# Patient Record
Sex: Female | Born: 1995
Health system: Southern US, Community
[De-identification: ages and names within clinical notes are randomized; demographics above are authoritative.]

## PROBLEM LIST (undated history)

## (undated) DIAGNOSIS — A6009 Herpesviral infection of other urogenital tract: Secondary | ICD-10-CM

## (undated) DIAGNOSIS — G43709 Chronic migraine without aura, not intractable, without status migrainosus: Secondary | ICD-10-CM

## (undated) DIAGNOSIS — R519 Headache, unspecified: Secondary | ICD-10-CM

## (undated) DIAGNOSIS — IMO0002 Reserved for concepts with insufficient information to code with codable children: Secondary | ICD-10-CM

## (undated) DIAGNOSIS — K219 Gastro-esophageal reflux disease without esophagitis: Secondary | ICD-10-CM

## (undated) DIAGNOSIS — F329 Major depressive disorder, single episode, unspecified: Secondary | ICD-10-CM

## (undated) DIAGNOSIS — F4310 Post-traumatic stress disorder, unspecified: Secondary | ICD-10-CM

## (undated) DIAGNOSIS — J189 Pneumonia, unspecified organism: Secondary | ICD-10-CM

## (undated) DIAGNOSIS — T7840XA Allergy, unspecified, initial encounter: Secondary | ICD-10-CM

## (undated) DIAGNOSIS — F419 Anxiety disorder, unspecified: Secondary | ICD-10-CM

## (undated) DIAGNOSIS — F32A Depression, unspecified: Secondary | ICD-10-CM

## (undated) DIAGNOSIS — J45909 Unspecified asthma, uncomplicated: Secondary | ICD-10-CM

## (undated) HISTORY — DX: Herpesviral infection of other urogenital tract: A60.09

## (undated) HISTORY — DX: Allergy, unspecified, initial encounter: T78.40XA

## (undated) HISTORY — DX: Unspecified asthma, uncomplicated: J45.909

## (undated) HISTORY — DX: Major depressive disorder, single episode, unspecified: F32.9

## (undated) HISTORY — PX: ESOPHAGOGASTRODUODENOSCOPY ENDOSCOPY: SHX5814

## (undated) HISTORY — DX: Anxiety disorder, unspecified: F41.9

## (undated) HISTORY — PX: OTHER SURGICAL HISTORY: SHX169

## (undated) HISTORY — PX: ACNE CYST REMOVAL: SUR1112

## (undated) HISTORY — DX: Post-traumatic stress disorder, unspecified: F43.10

## (undated) HISTORY — DX: Depression, unspecified: F32.A

## (undated) HISTORY — PX: ABDOMINAL HYSTERECTOMY: SHX81

---
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
## 2016-08-31 ENCOUNTER — Ambulatory Visit: Payer: Self-pay | Admitting: Internal Medicine

## 2016-09-21 ENCOUNTER — Encounter: Payer: Self-pay | Admitting: Internal Medicine

## 2016-09-21 ENCOUNTER — Ambulatory Visit (INDEPENDENT_AMBULATORY_CARE_PROVIDER_SITE_OTHER): Payer: Managed Care, Other (non HMO) | Admitting: Internal Medicine

## 2016-09-21 VITALS — BP 130/76 | HR 90 | Ht 64.0 in | Wt 309.0 lb

## 2016-09-21 DIAGNOSIS — F4310 Post-traumatic stress disorder, unspecified: Secondary | ICD-10-CM | POA: Insufficient documentation

## 2016-09-21 DIAGNOSIS — Z6841 Body Mass Index (BMI) 40.0 and over, adult: Secondary | ICD-10-CM | POA: Diagnosis not present

## 2016-09-21 DIAGNOSIS — F410 Panic disorder [episodic paroxysmal anxiety] without agoraphobia: Secondary | ICD-10-CM

## 2016-09-21 DIAGNOSIS — J4520 Mild intermittent asthma, uncomplicated: Secondary | ICD-10-CM | POA: Diagnosis not present

## 2016-09-21 DIAGNOSIS — A6000 Herpesviral infection of urogenital system, unspecified: Secondary | ICD-10-CM
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
## 2017-03-17 ENCOUNTER — Encounter: Payer: Managed Care, Other (non HMO) | Admitting: Internal Medicine

## 2017-10-04 ENCOUNTER — Other Ambulatory Visit: Payer: Self-pay

## 2017-10-04 ENCOUNTER — Emergency Department
Admission: EM | Admit: 2017-10-04 | Discharge: 2017-10-04 | Disposition: A | Discharge status: Home / Self Care | Payer: Managed Care, Other (non HMO) | Attending: Emergency Medicine | Admitting: Emergency Medicine

## 2017-10-04 ENCOUNTER — Encounter: Payer: Self-pay | Admitting: Emergency Medicine

## 2017-10-04 DIAGNOSIS — J4520 Mild intermittent asthma, uncomplicated: Secondary | ICD-10-CM | POA: Insufficient documentation

## 2017-10-04 DIAGNOSIS — F329 Major depressive disorder, single episode, unspecified: Secondary | ICD-10-CM | POA: Diagnosis not present

## 2017-10-04 DIAGNOSIS — F331 Major depressive disorder, recurrent, moderate: Secondary | ICD-10-CM

## 2017-10-04 DIAGNOSIS — Z79899 Other long term (current) drug therapy: Secondary | ICD-10-CM | POA: Diagnosis not present

## 2017-10-04 DIAGNOSIS — F332 Major depressive disorder, recurrent severe without psychotic features: Secondary | ICD-10-CM

## 2017-10-04 LAB — CBC
HEMATOCRIT: 37 % (ref 35.0–47.0)
Hemoglobin: 12.4 g/dL (ref 12.0–16.0)
MCH: 24.9 pg — ABNORMAL LOW (ref 26.0–34.0)
MCHC: 33.4 g/dL (ref 32.0–36.0)
MCV: 74.6 fL — ABNORMAL LOW (ref 80.0–100.0)
Platelets: 295 10*3/uL (ref 150–440)
RBC: 4.96 MIL/uL (ref 3.80–5.20)
RDW: 14.7 % — ABNORMAL HIGH (ref 11.5–14.5)
WBC: 10.9 10*3/uL (ref 3.6–11.0)

## 2017-10-04 LAB — URINE DRUG SCREEN, QUALITATIVE (ARMC ONLY)
Amphetamines, Ur Screen: NOT DETECTED
BARBITURATES, UR SCREEN: NOT DETECTED
Cannabinoid 50 Ng, Ur ~~LOC~~: NOT DETECTED
Cocaine Metabolite,Ur ~~LOC~~: NOT DETECTED
MDMA (ECSTASY) UR SCREEN: NOT DETECTED
METHADONE SCREEN, URINE: NOT DETECTED
OPIATE, UR SCREEN: NOT DETECTED
Phencyclidine (PCP) Ur S: NOT DETECTED
Tricyclic, Ur Screen: NOT DETECTED

## 2017-10-04 LAB — COMPREHENSIVE METABOLIC PANEL
ALBUMIN: 4.3 g/dL (ref 3.5–5.0)
ALK PHOS: 65 U/L (ref 38–126)
ALT: 18 U/L (ref 0–44)
ANION GAP: 12 (ref 5–15)
AST: 18 U/L (ref 15–41)
BUN: 16 mg/dL (ref 6–20)
CALCIUM: 9.5 mg/dL (ref 8.9–10.3)
CO2: 23 mmol/L (ref 22–32)
CREATININE: 0.8 mg/dL (ref 0.44–1.00)
Chloride: 105 mmol/L (ref 98–111)
GFR calc Af Amer: >60 mL/min (ref >60)
GFR calc non Af Amer: >60 mL/min (ref >60)
Glucose, Bld: 101 mg/dL — ABNORMAL HIGH (ref 70–99)
Potassium: 4.1 mmol/L (ref 3.5–5.1)
SODIUM: 140 mmol/L (ref 135–145)
Total Bilirubin: 0.5 mg/dL (ref 0.3–1.2)
Total Protein: 8.4 g/dL — ABNORMAL HIGH (ref 6.5–8.1)

## 2017-10-04 LAB — ACETAMINOPHEN LEVEL

## 2017-10-04 LAB — POCT PREGNANCY, URINE: Preg Test, Ur: NEGATIVE

## 2017-10-04 LAB — SALICYLATE LEVEL: Salicylate Lvl: <7 mg/dL (ref 2.8–30.0)

## 2017-10-04 LAB — ETHANOL: Alcohol, Ethyl (B): <10 mg/dL (ref <10)

## 2017-10-04 MED ORDER — BUPROPION HCL ER (XL) 150 MG PO TB24: 150.0000 mg | ORAL_TABLET | ORAL | 1 refills | Status: DC

## 2017-10-04 MED ORDER — SERTRALINE HCL 100 MG PO TABS: 150.0000 mg | ORAL_TABLET | Freq: Every day | ORAL | 1 refills | Status: DC

## 2017-10-04 MED ORDER — IBUPROFEN 600 MG PO TABS
600.0000 mg | ORAL_TABLET | Freq: Once | ORAL | Status: AC
  Administered 2017-10-04: 600 mg via ORAL
  Filled 2017-10-04: qty 1

## 2017-10-04 NOTE — ED Notes (Signed)
Patient is going to be discharged for outpatient treatment,nurse received prescriptions to be given to Patient at time of departure per Dr. Toni Amendlapacs.
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
## 2017-10-04 NOTE — ED Provider Notes (Addendum)
[facility] Emergency Department Provider Note  ____________________________________________  Time seen: Approximately 10:32 AM  I have reviewed the triage vital signs and the nursing notes.   HISTORY  Chief Complaint Psychiatric Evaluation    HPI Christina Hill is a 22 y.o. female who complains of depressed mood for the past 2 months.  She has a history of depression and has been compliant with her Zoloft without any changes in dosage recently.  No new stressors or other events that prompted her to come to the ED today except that she was revealing her feelings to a family member who encouraged her to seek help.  She has frequent passive thoughts of death, but denies any intent to harm herself.  No HI or hallucinations.  Sleep has been consistent with her usual sleep habits.  Appetite slightly diminished over the last week.      Past Medical History:  Diagnosis Date  . Allergy    seasonal allergies  . Anxiety   . Asthma    seasonal asthma  . Depression   . Herpes genitalis in women    type 1   . PTSD (post-traumatic stress disorder)      Patient Active Problem List   Diagnosis Date Noted  . Mild intermittent asthma without complication 09/21/2016  . PTSD (post-traumatic stress disorder) 09/21/2016  . Panic attacks 09/21/2016  . Genital herpes simplex 09/21/2016  . BMI 50.0-59.9, adult (HCC) 09/21/2016     Past Surgical History:  Procedure Laterality Date  . ACNE CYST REMOVAL     back- benign  . ingrown toenail     removal     Prior to Admission medications   Medication Sig Start Date End Date Taking? Authorizing Provider  albuterol (PROVENTIL HFA) 108 (90 Base) MCG/ACT inhaler Inhale into the lungs every 6 (six) hours as needed for wheezing or shortness of breath.    [provider]  Ascorbic Acid (VITAMIN C) 1000 MG tablet Take 1,000 mg by mouth daily.    [provider]  calcium acetate (PHOSLO) 667 MG  capsule Take by mouth 3 (three) times daily with meals.    [provider]  etonogestrel (NEXPLANON) 68 MG IMPL implant 1 each by Subdermal route once.    [provider]  LORazepam (ATIVAN) 0.5 MG tablet Take 0.5 mg by mouth as needed for anxiety.    [provider]  Multiple Vitamins-Minerals (MULTIVITAMIN WITH MINERALS) tablet Take 1 tablet by mouth daily.    [provider]  sertraline (ZOLOFT) 100 MG tablet Take 100 mg by mouth daily.    [provider]  valACYclovir (VALTREX) 500 MG tablet Take 500 mg by mouth 2 (two) times daily.    [provider]     Allergies Amoxicillin; Cephalosporins; Penicillins; Sodium hypochlorite; and Sulfa antibiotics   Family History  Problem Relation Age of Onset  . Depression Mother   . Hypertension Mother   . Hyperlipidemia Mother   . Depression Father   . Depression Brother   . Hypertension Brother   . Thyroid cancer Maternal Aunt   . Breast cancer Maternal Aunt   . Depression Maternal Aunt   . Hypertension Maternal Uncle   . Hypertension Paternal Uncle   . Heart attack Paternal Grandfather   . Heart disease Paternal Grandfather   . Hypertension Paternal Grandfather     Social History Social History   Tobacco Use  . Smoking status: Never Smoker  . Smokeless tobacco: Never Used  Substance Use Topics  . Alcohol use: Yes    Alcohol/week: 0.6 oz    Types: 1 Glasses of wine per week    Comment: 0.5 of wine weekly  . Drug use: No    Review of Systems  Constitutional:   No fever or chills.  ENT:   No sore throat. No rhinorrhea. Cardiovascular:   No chest pain or syncope. Respiratory:   No dyspnea or cough. Gastrointestinal:   Negative for abdominal pain, vomiting and diarrhea.  Musculoskeletal:   Negative for focal pain or swelling All other systems reviewed and are negative except as documented above in ROS and HPI.  ____________________________________________   PHYSICAL  EXAM:  VITAL SIGNS: ED Triage Vitals  Enc Vitals Group     BP 10/04/17 0856 (!) 156/104     Pulse Rate 10/04/17 0856 (!) 102     Resp 10/04/17 0856 20     Temp 10/04/17 0856 98.2 F (36.8 C)     Temp Source 10/04/17 0856 Oral     SpO2 10/04/17 0856 100 %     Weight 10/04/17 0858 (!) 305 lb (138.3 kg)     Height 10/04/17 0858 5\' 5"  (1.651 m)     Head Circumference --      Peak Flow --      Pain Score 10/04/17 0916 4     Pain Loc --      Pain Edu? --      Excl. in GC? --     Vital signs reviewed, nursing assessments reviewed.   Constitutional:   Alert and oriented. Non-toxic appearance.  Morbidly obese Eyes:   Conjunctivae are normal. EOMI. ENT      Head:   Normocephalic and atraumatic.      Nose:   No congestion/rhinnorhea.       Mouth/Throat:   MMM, no pharyngeal erythema. No peritonsillar mass.       Neck:   No meningismus. Full ROM. Hematological/Lymphatic/Immunilogical:   No cervical lymphadenopathy. Cardiovascular:   RRR. Symmetric bilateral radial and DP pulses.  No murmurs. Cap refill less than 2 seconds. Respiratory:   Normal respiratory effort without tachypnea/retractions. Breath sounds are clear and equal bilaterally. No wheezes/rales/rhonchi. Gastrointestinal:   Soft and nontender. Non distended. There is no CVA tenderness.  No rebound, rigidity, or guarding. Genitourinary:   deferred Musculoskeletal:   Normal range of motion in all extremities. No joint effusions.  No lower extremity tenderness.  No edema. Neurologic:   Normal speech and language.  Motor grossly intact. No acute focal neurologic deficits are appreciated.  Skin:    Skin is warm, dry and intact. No rash noted.  No petechiae, purpura, or bullae.  ____________________________________________    LABS (pertinent positives/negatives) (all labs ordered are listed, but only abnormal results are displayed) Labs Reviewed  COMPREHENSIVE METABOLIC PANEL - Abnormal; Notable for the following  components:      Result Value   Glucose, Bld 101 (*)    Total Protein 8.4 (*)    All other components within normal limits  ACETAMINOPHEN LEVEL - Abnormal; Notable for the following components:   Acetaminophen (Tylenol), Serum <10 (*)    All other components within normal limits  CBC - Abnormal; Notable for the following components:   MCV 74.6 (*)    MCH 24.9 (*)    RDW 14.7 (*)    All other components within normal limits  URINE DRUG SCREEN, QUALITATIVE ([facility] ONLY) - Abnormal; Notable for the following components:   Benzodiazepine,  Ur Scrn TEST NOT PERFORMED, REAGENT NOT AVAILABLE (*)    All other components within normal limits  ETHANOL  SALICYLATE LEVEL  POC URINE PREG, ED  POCT PREGNANCY, URINE   ____________________________________________   EKG    ____________________________________________    RADIOLOGY  No results found.  ____________________________________________   PROCEDURES Procedures  ____________________________________________    CLINICAL IMPRESSION / ASSESSMENT AND PLAN / ED COURSE  Pertinent labs & imaging results that were available during my care of the patient were reviewed by me and considered in my medical decision making (see chart for details).    Patient not in distress, presents with symptoms of depression.  Exam is unremarkable.  Medically stable to proceed with psychiatric evaluation and management.  Psychiatry consult requested.      ----------------------------------------- 3:31 PM on 10/04/2017 -----------------------------------------  Case discussed with Dr. Toni Amend after his evaluation of the patient.  Patient wants to be discharged home, so he is adjusting her medications.  Recommended that she follow-up with behavioral medicine at [facility] which is covered by her health plan.  Not a danger to herself or others, agreeable to follow-up.  ____________________________________________   FINAL CLINICAL IMPRESSION(S) / ED  DIAGNOSES    Final diagnoses:  Moderate episode of recurrent major depressive disorder [facility])     ED Discharge Orders    None      Portions of this note were generated with dragon dictation software. Dictation errors may occur despite best attempts at proofreading.    Sharman Cheek, MD 10/04/17 1034    Sharman Cheek, MD 10/04/17 [phone]

## 2017-10-04 NOTE — ED Notes (Signed)
Dr. Clapacs talking with patient.  
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
## 2017-10-04 NOTE — ED Notes (Signed)
First Nurse Note: Patient here with [place] [employer] for voluntary admission for Behavioral Health. Placed in private waiting are inside Triage.

## 2017-10-04 NOTE — ED Notes (Signed)
Patient in room 1 BHU, she is calm and cooperative, wanted to call her boss, nurse oriented her to unit and gave her extra blanket and phone, will continue to monitor, q 15 minute checks and camera surveillance in progress for safety.

## 2017-10-04 NOTE — ED Notes (Signed)
Patient discharged home,  patient received discharge papers and prescription. Patient received belongings and verbalized she has received all of her belongings. Patient appropriate and cooperative, Denies SI/HI AVH. Vital signs taken. NAD noted. 

## 2017-10-04 NOTE — ED Notes (Addendum)
Pt dressed out into appropriate behavioral health clothing with this tech and Ashley,RN in the rm. Pt belongings consist of grey tennis shoes, blue pants, a black jacket, a blue scrub top, pink/yellow sunglasses, a white ring, a pair of white earrings with purple stone, a white necklace with a charm that says aunt, orange socks, a black hair bow, a set of keys, a black bra, gray panties black cell phone and a black wallet. Pt states "I can not take out my nose ring." Pt calm and cooperative while dressing out.
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
## 2017-10-04 NOTE — ED Notes (Signed)
Pt reports SI with no plan.  Denies A/V hallucinations.  Reports tingling in her feet.  Pt states she thinks this is a side effect of the Topamax.  Pt reports that her appetite has been okay over the past several days.  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 Pt reports a history of panic attacks, but states these have increased over the past 2 months.  States that she takes Ativan 0.5 mg PO PRN as needed.  States this has been effective.

## 2018-08-03 ENCOUNTER — Other Ambulatory Visit: Payer: Self-pay

## 2018-08-03 ENCOUNTER — Observation Stay
Admission: EM | Admit: 2018-08-03 | Discharge: 2018-08-05 | Disposition: A | Discharge status: Other Acute Inpatient Hospital | Payer: 59 | Attending: Internal Medicine | Admitting: Internal Medicine

## 2018-08-03 ENCOUNTER — Encounter: Payer: Self-pay | Admitting: Emergency Medicine

## 2018-08-03 DIAGNOSIS — Z6841 Body Mass Index (BMI) 40.0 and over, adult: Secondary | ICD-10-CM | POA: Diagnosis not present

## 2018-08-03 DIAGNOSIS — F4310 Post-traumatic stress disorder, unspecified: Secondary | ICD-10-CM | POA: Diagnosis present

## 2018-08-03 DIAGNOSIS — E669 Obesity, unspecified: Secondary | ICD-10-CM | POA: Diagnosis not present

## 2018-08-03 DIAGNOSIS — F332 Major depressive disorder, recurrent severe without psychotic features: Secondary | ICD-10-CM | POA: Diagnosis not present

## 2018-08-03 DIAGNOSIS — T461X1A Poisoning by calcium-channel blockers, accidental (unintentional), initial encounter: Secondary | ICD-10-CM | POA: Diagnosis not present

## 2018-08-03 DIAGNOSIS — Z1159 Encounter for screening for other viral diseases: Secondary | ICD-10-CM | POA: Diagnosis not present

## 2018-08-03 DIAGNOSIS — Z7951 Long term (current) use of inhaled steroids: Secondary | ICD-10-CM | POA: Insufficient documentation

## 2018-08-03 DIAGNOSIS — A6000 Herpesviral infection of urogenital system, unspecified: Secondary | ICD-10-CM | POA: Insufficient documentation

## 2018-08-03 DIAGNOSIS — I10 Essential (primary) hypertension: Secondary | ICD-10-CM | POA: Diagnosis not present

## 2018-08-03 DIAGNOSIS — J309 Allergic rhinitis, unspecified: Secondary | ICD-10-CM | POA: Diagnosis not present

## 2018-08-03 DIAGNOSIS — Z803 Family history of malignant neoplasm of breast: Secondary | ICD-10-CM | POA: Diagnosis not present

## 2018-08-03 DIAGNOSIS — T1491XA Suicide attempt, initial encounter: Principal | ICD-10-CM | POA: Insufficient documentation

## 2018-08-03 DIAGNOSIS — E875 Hyperkalemia: Secondary | ICD-10-CM | POA: Insufficient documentation

## 2018-08-03 DIAGNOSIS — Z793 Long term (current) use of hormonal contraceptives: Secondary | ICD-10-CM | POA: Diagnosis not present

## 2018-08-03 DIAGNOSIS — X58XXXA Exposure to other specified factors, initial encounter: Secondary | ICD-10-CM | POA: Diagnosis not present

## 2018-08-03 DIAGNOSIS — G43909 Migraine, unspecified, not intractable, without status migrainosus: Secondary | ICD-10-CM | POA: Insufficient documentation

## 2018-08-03 DIAGNOSIS — T461X2A Poisoning by calcium-channel blockers, intentional self-harm, initial encounter: Secondary | ICD-10-CM

## 2018-08-03 DIAGNOSIS — Z8249 Family history of ischemic heart disease and other diseases of the circulatory system: Secondary | ICD-10-CM | POA: Diagnosis not present

## 2018-08-03 DIAGNOSIS — Z79899 Other long term (current) drug therapy: Secondary | ICD-10-CM | POA: Diagnosis not present

## 2018-08-03 DIAGNOSIS — F418 Other specified anxiety disorders: Secondary | ICD-10-CM | POA: Insufficient documentation

## 2018-08-03 DIAGNOSIS — E872 Acidosis: Secondary | ICD-10-CM | POA: Diagnosis not present

## 2018-08-03 DIAGNOSIS — N179 Acute kidney failure, unspecified: Secondary | ICD-10-CM | POA: Insufficient documentation

## 2018-08-03 DIAGNOSIS — J4520 Mild intermittent asthma, uncomplicated: Secondary | ICD-10-CM | POA: Insufficient documentation

## 2018-08-03 DIAGNOSIS — F410 Panic disorder [episodic paroxysmal anxiety] without agoraphobia: Secondary | ICD-10-CM | POA: Diagnosis present

## 2018-08-03 LAB — CBC WITH DIFFERENTIAL/PLATELET
Abs Immature Granulocytes: 0.09 10*3/uL — ABNORMAL HIGH (ref 0.00–0.07)
Basophils Absolute: 0 10*3/uL (ref 0.0–0.1)
Basophils Relative: 0 %
Eosinophils Absolute: 0.2 10*3/uL (ref 0.0–0.5)
Eosinophils Relative: 1 %
HCT: 38.5 % (ref 36.0–46.0)
Hemoglobin: 12.6 g/dL (ref 12.0–15.0)
Immature Granulocytes: 1 %
Lymphocytes Relative: 27 %
Lymphs Abs: 3.7 10*3/uL (ref 0.7–4.0)
MCH: 24.7 pg — ABNORMAL LOW (ref 26.0–34.0)
MCHC: 32.7 g/dL (ref 30.0–36.0)
MCV: 75.3 fL — ABNORMAL LOW (ref 80.0–100.0)
Monocytes Absolute: 0.7 10*3/uL (ref 0.1–1.0)
Monocytes Relative: 5 %
Neutro Abs: 9 10*3/uL — ABNORMAL HIGH (ref 1.7–7.7)
Neutrophils Relative %: 66 %
Platelets: 321 10*3/uL (ref 150–400)
RBC: 5.11 MIL/uL (ref 3.87–5.11)
RDW: 14.5 % (ref 11.5–15.5)
WBC: 13.6 10*3/uL — ABNORMAL HIGH (ref 4.0–10.5)
nRBC: 0 % (ref 0.0–0.2)

## 2018-08-03 MED ORDER — SODIUM CHLORIDE 0.9 % IV BOLUS
1000.0000 mL | Freq: Once | INTRAVENOUS | Status: AC
  Administered 2018-08-04: 1000 mL via INTRAVENOUS

## 2018-08-03 MED ORDER — SODIUM CHLORIDE 0.9 % IV BOLUS
1000.0000 mL | Freq: Once | INTRAVENOUS | Status: AC
  Administered 2018-08-03: 1000 mL via INTRAVENOUS

## 2018-08-03 MED ORDER — CHARCOAL ACTIVATED PO LIQD
50.0000 g | Freq: Once | ORAL | Status: AC
  Administered 2018-08-04: 50 g via ORAL

## 2018-08-03 MED ORDER — SODIUM CHLORIDE 0.9 % IV SOLN
INTRAVENOUS | Status: DC
  Administered 2018-08-04: 02:00:00 via INTRAVENOUS
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
## 2018-08-03 NOTE — ED Provider Notes (Signed)
[facility] Emergency Department Provider Note    None    (approximate)  I have reviewed the triage vital signs and the nursing notes.   HISTORY  Chief Complaint Drug Overdose    HPI Christina Hill is a 23 y.o. female with 4 previous suicide attempt via overdose presents to the emergency department after an intentional medication overdose which occurred approximately 40 minutes before arrival.  Patient states that she took 5 tablets of verapamil 240 mg of capsules "to kill myself".   Past Medical History:  Diagnosis Date  . Allergy    seasonal allergies  . Anxiety   . Asthma    seasonal asthma  . Depression   . Herpes genitalis in women    type 1   . PTSD (post-traumatic stress disorder)     Patient Active Problem List   Diagnosis Date Noted  . Severe recurrent major depression without psychotic features (HCC) 10/04/2017  . Mild intermittent asthma without complication 09/21/2016  . PTSD (post-traumatic stress disorder) 09/21/2016  . Panic attacks 09/21/2016  . Genital herpes simplex 09/21/2016  . BMI 50.0-59.9, adult (HCC) 09/21/2016    Past Surgical History:  Procedure Laterality Date  . ACNE CYST REMOVAL     back- benign  . ingrown toenail     removal    Prior to Admission medications   Medication Sig Start Date End Date Taking? Authorizing Provider  albuterol (PROVENTIL HFA) 108 (90 Base) MCG/ACT inhaler Inhale into the lungs every 6 (six) hours as needed for wheezing or shortness of breath.    [provider]  Ascorbic Acid (VITAMIN C) 1000 MG tablet Take 1,000 mg by mouth daily.    [provider]  buPROPion (WELLBUTRIN XL) 150 MG 24 hr tablet Take 1 tablet (150 mg total) by mouth every morning. 10/04/17 12/03/17  Clapacs, [name], MD  calcium acetate (PHOSLO) 667 MG capsule Take by mouth 3 (three) times daily with meals.    [provider]  etonogestrel (NEXPLANON) 68 MG IMPL implant 1 each by  Subdermal route once.    [provider]  LORazepam (ATIVAN) 0.5 MG tablet Take 0.5 mg by mouth as needed for anxiety.    [provider]  Multiple Vitamins-Minerals (MULTIVITAMIN WITH MINERALS) tablet Take 1 tablet by mouth daily.    [provider]  sertraline (ZOLOFT) 100 MG tablet Take 1.5 tablets (150 mg total) by mouth daily. 10/04/17 12/03/17  Clapacs, [name], MD  valACYclovir (VALTREX) 500 MG tablet Take 500 mg by mouth 2 (two) times daily.    [provider]    Allergies Amoxicillin; Cephalosporins; Penicillins; Sodium hypochlorite; and Sulfa antibiotics  Family History  Problem Relation Age of Onset  . Depression Mother   . Hypertension Mother   . Hyperlipidemia Mother   . Depression Father   . Depression Brother   . Hypertension Brother   . Thyroid cancer Maternal Aunt   . Breast cancer Maternal Aunt   . Depression Maternal Aunt   . Hypertension Maternal Uncle   . Hypertension Paternal Uncle   . Heart attack Paternal Grandfather   . Heart disease Paternal Grandfather   . Hypertension Paternal Grandfather     Social History Social History   Tobacco Use  . Smoking status: Never Smoker  . Smokeless tobacco: Never Used  Substance Use Topics  . Alcohol use: Yes    Alcohol/week: 1.0 standard drinks    Types: 1 Glasses of wine per week  Comment: 0.5 of wine weekly  . Drug use: No    Review of Systems Constitutional: No fever/chills Eyes: No visual changes. ENT: No sore throat. Cardiovascular: Denies chest pain. Respiratory: Denies shortness of breath. Gastrointestinal: No abdominal pain.  No nausea, no vomiting.  No diarrhea.  No constipation. Genitourinary: Negative for dysuria. Musculoskeletal: Negative for neck pain.  Negative for back pain. Integumentary: Negative for rash. Neurological: Negative for headaches, focal weakness or numbness. Psychiatric:  Positive for medication overdose suicide attempt    ____________________________________________   PHYSICAL EXAM:  VITAL SIGNS: ED Triage Vitals  Enc Vitals Group     BP 08/03/18 2308 (!) 169/107     Pulse Rate 08/03/18 2308 (!) 110     Resp 08/03/18 2308 (!) 9     Temp 08/03/18 2306 98.8 F (37.1 C)     Temp Source 08/03/18 2306 Oral     SpO2 08/03/18 2308 99 %     Weight 08/03/18 2305 (!) 158.8 kg (350 lb)     Height 08/03/18 2305 1.651 m ( )     Head Circumference --      Peak Flow --      Pain Score 08/03/18 2305 0     Pain Loc --      Pain Edu? --      Excl. in GC? --     Constitutional: Alert and oriented. Well appearing and in no acute distress. Eyes: Conjunctivae are normal. Mouth/Throat: Mucous membranes are moist.  Oropharynx non-erythematous. Neck: No stridor.  Cardiovascular: Normal rate, regular rhythm. Good peripheral circulation. Grossly normal heart sounds. Respiratory: Normal respiratory effort.  No retractions. No audible wheezing. Gastrointestinal: Soft and nontender. No distention.  Musculoskeletal: No lower extremity tenderness nor edema. No gross deformities of extremities. Neurologic:  Normal speech and language. No gross focal neurologic deficits are appreciated.  Skin:  Skin is warm, dry and intact. No rash noted. Psychiatric: Mood and affect are normal. Speech and behavior are normal.  ____________________________________________   LABS (all labs ordered are listed, but only abnormal results are displayed)  Labs Reviewed  COMPREHENSIVE METABOLIC PANEL - Abnormal; Notable for the following components:      Result Value   Potassium 5.2 (*)    Glucose, Bld 145 (*)    Creatinine, Ser 1.05 (*)    All other components within normal limits  ACETAMINOPHEN LEVEL - Abnormal; Notable for the following components:   Acetaminophen (Tylenol), Serum <10 (*)    All other components within normal limits  CBC WITH DIFFERENTIAL/PLATELET - Abnormal; Notable for the following components:   WBC 13.6 (*)     MCV 75.3 (*)    MCH 24.7 (*)    Neutro Abs 9.0 (*)    Abs Immature Granulocytes 0.09 (*)    All other components within normal limits  LACTIC ACID, PLASMA - Abnormal; Notable for the following components:   Lactic Acid, Venous 2.8 (*)    All other components within normal limits  SARS CORONAVIRUS 2 (HOSPITAL ORDER, PERFORMED IN [facility])  SALICYLATE LEVEL  ETHANOL  URINE DRUG SCREEN, QUALITATIVE ([facility] ONLY)  PREGNANCY, URINE  LACTIC ACID, PLASMA  ACETAMINOPHEN LEVEL  CBG MONITORING, ED   ____________________________________________  EKG  ED ECG REPORT I, [place] N Edwardo Wojnarowski, the attending physician, personally viewed and interpreted this ECG.   Date: 08/03/2018  EKG Time: 11:06 PM  Rate: 99  Rhythm: Normal sinus rhythm  Axis: Normal  intervals: Normal  ST&T Change: None  Procedures   ____________________________________________   INITIAL IMPRESSION / MDM / ASSESSMENT AND PLAN / ED COURSE  As part of my medical decision making, I reviewed the following data within the electronic MEDICAL RECORD NUMBER   23 year old female presenting to the emergency department secondary to above-stated history and physical exam due to medication overdose suicide attempt.  Patient was involuntarily committed by police officers before arrival.  Patient given activated charcoal 50 g in the emergency department.  Laboratory data notable for an elevated lactic acid of 2.8.  Creatinine 1.05 white blood cell count of 13.6.  Patient given 2 L IV normal saline.  Patient discussed with poison control.  Patient subsequently discussed with hospital staff for admission for observation and subsequently psychiatric evaluation.     *Loudell Hirani was evaluated in Emergency Department on 08/04/2018 for the symptoms described in the history of present illness. She was evaluated in the context of the global COVID-19 pandemic, which necessitated consideration that the patient might be at risk  for infection with the SARS-CoV-2 virus that causes COVID-19. Institutional protocols and algorithms that pertain to the evaluation of patients at risk for COVID-19 are in a state of rapid change based on information released by regulatory bodies including the CDC and federal and state organizations. These policies and algorithms were followed during the patient's care in the ED.  Some ED evaluations and interventions may be delayed as a result of limited staffing during the pandemic.*    ____________________________________________  FINAL CLINICAL IMPRESSION(S) / ED DIAGNOSES  Final diagnoses:  Calcium channel blocker overdose, intentional self-harm, initial encounter (HCC)  Suicide attempt [facility])     MEDICATIONS GIVEN DURING THIS VISIT:  Medications  sodium chloride 0.9 % bolus 1,000 mL (1,000 mLs Intravenous New Bag/Given 08/04/18 0037)    And  0.9 %  sodium chloride infusion (has no administration in time range)  sodium chloride 0.9 % bolus 1,000 mL (0 mLs Intravenous Stopped 08/04/18 0106)  charcoal activated (NO SORBITOL) (ACTIDOSE-AQUA) suspension 50 g (50 g Oral Given 08/04/18 0007)  ondansetron (ZOFRAN) injection 4 mg (4 mg Intravenous Given 08/04/18 0054)     ED Discharge Orders    None       Note:  This document was prepared using Dragon voice recognition software and may include unintentional dictation errors.   Darci Current, MD 08/04/18 [phone]

## 2018-08-03 NOTE — ED Notes (Signed)
Verbal order for PO charcoal per MD Manson Passey. Pt given PO charcoal at this time.

## 2018-08-03 NOTE — ED Notes (Addendum)
Patient dressed out into a hospital gown. Belongings placed in belongings bag. Bag consist of:  1-Sliver color necklace with two silver charms and a gold color ring 1- silver colored band with clear stone around band 1- silver colored band with one clear stone in middle 1- silver colored bracelet 1-silver color toe ring 2- plastic bracelets (1-white, 1-black) 1-grey tee shirt 1- grey color pant with multi color hearts 1- purple underwear 1 pair of tan flip flops

## 2018-08-04 ENCOUNTER — Other Ambulatory Visit: Payer: Self-pay

## 2018-08-04 DIAGNOSIS — F410 Panic disorder [episodic paroxysmal anxiety] without agoraphobia: Secondary | ICD-10-CM

## 2018-08-04 DIAGNOSIS — T1491XA Suicide attempt, initial encounter: Secondary | ICD-10-CM | POA: Diagnosis not present

## 2018-08-04 DIAGNOSIS — F4310 Post-traumatic stress disorder, unspecified: Secondary | ICD-10-CM

## 2018-08-04 DIAGNOSIS — T461X2A Poisoning by calcium-channel blockers, intentional self-harm, initial encounter: Secondary | ICD-10-CM | POA: Diagnosis not present

## 2018-08-04 DIAGNOSIS — F332 Major depressive disorder, recurrent severe without psychotic features: Secondary | ICD-10-CM

## 2018-08-04 DIAGNOSIS — T461X1A Poisoning by calcium-channel blockers, accidental (unintentional), initial encounter: Secondary | ICD-10-CM | POA: Diagnosis present

## 2018-08-04 LAB — URINE DRUG SCREEN, QUALITATIVE (ARMC ONLY)
Amphetamines, Ur Screen: NOT DETECTED
Barbiturates, Ur Screen: NOT DETECTED
Benzodiazepine, Ur Scrn: NOT DETECTED
Cannabinoid 50 Ng, Ur ~~LOC~~: NOT DETECTED
Cocaine Metabolite,Ur ~~LOC~~: NOT DETECTED
MDMA (Ecstasy)Ur Screen: NOT DETECTED
Methadone Scn, Ur: NOT DETECTED
Opiate, Ur Screen: NOT DETECTED
Phencyclidine (PCP) Ur S: NOT DETECTED
Tricyclic, Ur Screen: NOT DETECTED

## 2018-08-04 LAB — COMPREHENSIVE METABOLIC PANEL
ALT: 18 U/L (ref 0–44)
AST: 33 U/L (ref 15–41)
Albumin: 3.7 g/dL (ref 3.5–5.0)
Alkaline Phosphatase: 57 U/L (ref 38–126)
Anion gap: 9 (ref 5–15)
BUN: 13 mg/dL (ref 6–20)
CO2: 23 mmol/L (ref 22–32)
Calcium: 9.1 mg/dL (ref 8.9–10.3)
Chloride: 107 mmol/L (ref 98–111)
Creatinine, Ser: 1.05 mg/dL — ABNORMAL HIGH (ref 0.44–1.00)
GFR calc Af Amer: >60 mL/min (ref >60)
GFR calc non Af Amer: >60 mL/min (ref >60)
Glucose, Bld: 145 mg/dL — ABNORMAL HIGH (ref 70–99)
Potassium: 5.2 mmol/L — ABNORMAL HIGH (ref 3.5–5.1)
Sodium: 139 mmol/L (ref 135–145)
Total Bilirubin: 1 mg/dL (ref 0.3–1.2)
Total Protein: 7.8 g/dL (ref 6.5–8.1)

## 2018-08-04 LAB — BASIC METABOLIC PANEL
Anion gap: 13 (ref 5–15)
BUN: 10 mg/dL (ref 6–20)
CO2: 21 mmol/L — ABNORMAL LOW (ref 22–32)
Calcium: 8.5 mg/dL — ABNORMAL LOW (ref 8.9–10.3)
Chloride: 105 mmol/L (ref 98–111)
Creatinine, Ser: 0.71 mg/dL (ref 0.44–1.00)
GFR calc Af Amer: >60 mL/min (ref >60)
GFR calc non Af Amer: >60 mL/min (ref >60)
Glucose, Bld: 86 mg/dL (ref 70–99)
Potassium: 4 mmol/L (ref 3.5–5.1)
Sodium: 139 mmol/L (ref 135–145)

## 2018-08-04 LAB — CBC
HCT: 37.5 % (ref 36.0–46.0)
Hemoglobin: 12.1 g/dL (ref 12.0–15.0)
MCH: 24.5 pg — ABNORMAL LOW (ref 26.0–34.0)
MCHC: 32.3 g/dL (ref 30.0–36.0)
MCV: 75.9 fL — ABNORMAL LOW (ref 80.0–100.0)
Platelets: 298 10*3/uL (ref 150–400)
RBC: 4.94 MIL/uL (ref 3.87–5.11)
RDW: 14.5 % (ref 11.5–15.5)
WBC: 12.2 10*3/uL — ABNORMAL HIGH (ref 4.0–10.5)
nRBC: 0 % (ref 0.0–0.2)

## 2018-08-04 LAB — ACETAMINOPHEN LEVEL
Acetaminophen (Tylenol), Serum: <10 ug/mL — ABNORMAL LOW (ref 10–30)
Acetaminophen (Tylenol), Serum: <10 ug/mL — ABNORMAL LOW (ref 10–30)

## 2018-08-04 LAB — SALICYLATE LEVEL: Salicylate Lvl: <7 mg/dL (ref 2.8–30.0)

## 2018-08-04 LAB — LACTIC ACID, PLASMA
Lactic Acid, Venous: 2.8 mmol/L (ref 0.5–1.9)
Lactic Acid, Venous: 1.7 mmol/L (ref 0.5–1.9)

## 2018-08-04 LAB — ETHANOL: Alcohol, Ethyl (B): <10 mg/dL (ref <10)

## 2018-08-04 LAB — PREGNANCY, URINE: Preg Test, Ur: NEGATIVE

## 2018-08-04 LAB — SARS CORONAVIRUS 2 BY RT PCR (HOSPITAL ORDER, PERFORMED IN ~~LOC~~ HOSPITAL LAB): SARS Coronavirus 2: NEGATIVE

## 2018-08-04 MED ORDER — IBUPROFEN 400 MG PO TABS
400.0000 mg | ORAL_TABLET | Freq: Four times a day (QID) | ORAL | Status: DC | PRN
  Administered 2018-08-04: 400 mg via ORAL
  Filled 2018-08-04: qty 1

## 2018-08-04 MED ORDER — ENOXAPARIN SODIUM 40 MG/0.4ML ~~LOC~~ SOLN
40.0000 mg | Freq: Two times a day (BID) | SUBCUTANEOUS | Status: DC
  Filled 2018-08-04 (×3): qty 0.4

## 2018-08-04 MED ORDER — SODIUM CHLORIDE 0.9 % IV SOLN
INTRAVENOUS | Status: DC
  Administered 2018-08-04 – 2018-08-05 (×3): via INTRAVENOUS

## 2018-08-04 MED ORDER — ONDANSETRON HCL 4 MG/2ML IJ SOLN
4.0000 mg | Freq: Once | INTRAMUSCULAR | Status: AC
  Administered 2018-08-04: 4 mg via INTRAVENOUS
  Filled 2018-08-04: qty 2

## 2018-08-04 MED ORDER — ADULT MULTIVITAMIN W/MINERALS CH
1.0000 | ORAL_TABLET | Freq: Every day | ORAL | Status: DC
  Administered 2018-08-04 – 2018-08-05 (×2): 1 via ORAL
  Filled 2018-08-04 (×2): qty 1

## 2018-08-04 MED ORDER — ONDANSETRON HCL 4 MG/2ML IJ SOLN: 4.0000 mg | Freq: Four times a day (QID) | INTRAMUSCULAR | Status: DC | PRN

## 2018-08-04 MED ORDER — BUPROPION HCL ER (XL) 150 MG PO TB24
150.0000 mg | ORAL_TABLET | ORAL | Status: DC
  Administered 2018-08-04 – 2018-08-05 (×2): 150 mg via ORAL
  Filled 2018-08-04 (×2): qty 1

## 2018-08-04 MED ORDER — TRAZODONE HCL 50 MG PO TABS
25.0000 mg | ORAL_TABLET | Freq: Every evening | ORAL | Status: DC | PRN
  Administered 2018-08-04: 21:00:00 25 mg via ORAL
  Filled 2018-08-04: qty 1

## 2018-08-04 MED ORDER — ALBUTEROL SULFATE (2.5 MG/3ML) 0.083% IN NEBU: 2.5000 mg | INHALATION_SOLUTION | RESPIRATORY_TRACT | Status: DC | PRN

## 2018-08-04 MED ORDER — LORAZEPAM 0.5 MG PO TABS: 0.5000 mg | ORAL_TABLET | ORAL | Status: DC | PRN

## 2018-08-04 MED ORDER — CALCIUM ACETATE (PHOS BINDER) 667 MG PO CAPS
667.0000 mg | ORAL_CAPSULE | Freq: Three times a day (TID) | ORAL | Status: DC
  Administered 2018-08-04 – 2018-08-05 (×4): 667 mg via ORAL
  Filled 2018-08-04 (×7): qty 1

## 2018-08-04 MED ORDER — ACETAMINOPHEN 650 MG RE SUPP: 650.0000 mg | Freq: Four times a day (QID) | RECTAL | Status: DC | PRN

## 2018-08-04 MED ORDER — SERTRALINE HCL 50 MG PO TABS
50.0000 mg | ORAL_TABLET | Freq: Every day | ORAL | Status: AC
  Administered 2018-08-04: 14:00:00 50 mg via ORAL
  Filled 2018-08-04: qty 1

## 2018-08-04 MED ORDER — MAGNESIUM HYDROXIDE 400 MG/5ML PO SUSP
30.0000 mL | Freq: Every day | ORAL | Status: DC | PRN
  Filled 2018-08-04: qty 30

## 2018-08-04 MED ORDER — PRAZOSIN HCL 1 MG PO CAPS
1.0000 mg | ORAL_CAPSULE | Freq: Two times a day (BID) | ORAL | Status: DC
  Administered 2018-08-04 – 2018-08-05 (×3): 1 mg via ORAL
  Filled 2018-08-04 (×4): qty 1

## 2018-08-04 MED ORDER — ONDANSETRON HCL 4 MG PO TABS: 4.0000 mg | ORAL_TABLET | Freq: Four times a day (QID) | ORAL | Status: DC | PRN

## 2018-08-04 MED ORDER — SERTRALINE HCL 50 MG PO TABS
150.0000 mg | ORAL_TABLET | Freq: Every day | ORAL | Status: DC
  Administered 2018-08-04: 10:00:00 150 mg via ORAL
  Filled 2018-08-04: qty 3

## 2018-08-04 MED ORDER — VITAMIN C 500 MG PO TABS
1000.0000 mg | ORAL_TABLET | Freq: Every day | ORAL | Status: DC
  Administered 2018-08-04 – 2018-08-05 (×2): 1000 mg via ORAL
  Filled 2018-08-04 (×3): qty 2

## 2018-08-04 MED ORDER — ACETAMINOPHEN 325 MG PO TABS
650.0000 mg | ORAL_TABLET | Freq: Four times a day (QID) | ORAL | Status: DC | PRN
  Administered 2018-08-04: 10:00:00 650 mg via ORAL
  Filled 2018-08-04: qty 2

## 2018-08-04 MED ORDER — SERTRALINE HCL 50 MG PO TABS
200.0000 mg | ORAL_TABLET | Freq: Every day | ORAL | Status: DC
  Administered 2018-08-05: 200 mg via ORAL
  Filled 2018-08-04: qty 4

## 2018-08-04 NOTE — ED Notes (Signed)
Patient denies SI/HI this morning. Requesting phone to call her wife

## 2018-08-04 NOTE — ED Notes (Signed)
Pt wishes password for family members to be to be "skeeter".
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
## 2018-08-04 NOTE — ED Notes (Signed)
Verapamil sent to pharmacy for storage

## 2018-08-04 NOTE — Progress Notes (Signed)
[facility] - [place] at [facility]   PATIENT NAME: Christina Hill    MR#:  [id number]  DATE OF BIRTH:  1995-11-15  SUBJECTIVE:  CHIEF COMPLAINT:   Chief Complaint  Patient presents with  . Drug Overdose   -Came in after overdose of verapamil.  Complains of headache and nausea.  Appears depressed.  Sitter in the room  REVIEW OF SYSTEMS:  Review of Systems  Constitutional: Negative for chills, fever and malaise/fatigue.  HENT: Negative for congestion, ear discharge, hearing loss and nosebleeds.   Eyes: Negative for blurred vision and double vision.  Respiratory: Negative for cough, shortness of breath and wheezing.   Cardiovascular: Negative for chest pain, palpitations and leg swelling.  Gastrointestinal: Positive for nausea. Negative for abdominal pain, constipation, diarrhea and vomiting.  Genitourinary: Negative for dysuria.  Musculoskeletal: Negative for myalgias.  Neurological: Positive for dizziness. Negative for speech change, focal weakness, seizures, weakness and headaches.  Psychiatric/Behavioral: Positive for depression and suicidal ideas.    DRUG ALLERGIES:   Allergies  Allergen Reactions  . Amoxicillin Anaphylaxis  . Cephalosporins Anaphylaxis  . Penicillins Anaphylaxis and Hives  . Sodium Hypochlorite Anaphylaxis  . Sulfa Antibiotics Hives and Anaphylaxis  . Cucumber Extract Nausea And Vomiting  . Almond (Diagnostic)     Abdominal cramping    VITALS:  Blood pressure (!) 105/56, pulse 85, temperature 98 F (36.7 C), temperature source Oral, resp. rate 14, height 5\' 5"  (1.651 m), weight (!) 149.2 kg, SpO2 98 %.  PHYSICAL EXAMINATION:  Physical Exam   GENERAL:  23 y.o.-year-old obese patient lying in the bed with no acute distress.  EYES: Pupils equal, round, reactive to light and accommodation. No scleral icterus. Extraocular muscles intact.  HEENT: Head atraumatic, normocephalic. Oropharynx and nasopharynx clear.  NECK:  Supple, no  jugular venous distention. No thyroid enlargement, no tenderness.  LUNGS: Normal breath sounds bilaterally, no wheezing, rales,rhonchi or crepitation. No use of accessory muscles of respiration.  Decreased bibasilar breath sounds CARDIOVASCULAR: S1, S2 normal. No murmurs, rubs, or gallops.  ABDOMEN: Soft, nontender, nondistended. Bowel sounds present. No organomegaly or mass.  EXTREMITIES: No pedal edema, cyanosis, or clubbing.  NEUROLOGIC: Cranial nerves II through XII are intact. Muscle strength 5/5 in all extremities. Sensation intact. Gait not checked.  PSYCHIATRIC: The patient is alert and oriented x 3.  SKIN: No obvious rash, lesion, or ulcer.    LABORATORY PANEL:   CBC Recent Labs  Lab 08/03/18 2307  WBC 13.6*  HGB 12.6  HCT 38.5  PLT 321   ------------------------------------------------------------------------------------------------------------------  Chemistries  Recent Labs  Lab 08/03/18 2307 08/04/18 0846  NA 139 139  K 5.2* 4.0  CL 107 105  CO2 23 21*  GLUCOSE 145* 86  BUN 13 10  CREATININE 1.05* 0.71  CALCIUM 9.1 8.5*  AST 33  --   ALT 18  --   ALKPHOS 57  --   BILITOT 1.0  --    ------------------------------------------------------------------------------------------------------------------  Cardiac Enzymes No results for input(s): TROPONINI in the last 168 hours. ------------------------------------------------------------------------------------------------------------------  RADIOLOGY:  No results found.  EKG:   Orders placed or performed during the hospital encounter of 08/03/18  . ED EKG  . ED EKG  . EKG 12-Lead  . EKG 12-Lead  . EKG 12-Lead  . EKG 12-Lead    ASSESSMENT AND PLAN:   23 year old female with depression, anxiety, PTSD, migraine, asthma presents to hospital secondary to overdosing on verapamil.  1.  Major depressive disorder with drug overdose-suicidal ideation.  With overdose on 5 tablets of 40 mg of verapamil -Blood  pressure is stable.  Receiving IV fluids.  Not bradycardic -Poison control has been contacted.  Patient received oral charcoal. -Discontinue PhosLo as this is not in the recommendations. -Monitor on telemetry for 24 hours after overdose which was at 10 PM on 08/03/2018 -Psychiatry has been consulted.  Suicide precautions, sitter in the room -Will need inpatient psych admission after medical clearance.  2.  Acute renal failure and hyperkalemia-resolved with fluids.  3.  Lactic acidosis-improved with IV fluids  4.  Asthma-stable.  Will restart inhalers as needed  5.  Depression anxiety-currently on Wellbutrin, Zoloft and Ativan.  Restart her home medications.  Further management per psychiatrist.  6.  DVT prophylaxis-Lovenox  Independent at baseline   All the records are reviewed and case discussed with Care Management/Social Workerr. Management plans discussed with the patient, family and they are in agreement.  CODE STATUS: Full code  TOTAL TIME TAKING CARE OF THIS PATIENT: 32 minutes.   POSSIBLE D/C IN 1-2 DAYS, DEPENDING ON CLINICAL CONDITION.   Enid Baasadhika Jullianna Gabor M.D on 08/04/2018 at 11:22 AM  Between 7am to 6pm - Pager - [phone]  After 6pm go to [email or web address] - Social research officer, governmentpassword EPAS [email or web address]  [facility] [place] [facility]  Office  [phone]  CC: Primary care physician; Marina GoodellFeldpausch, Dale E, MD

## 2018-08-04 NOTE — Progress Notes (Signed)
Christina Hill is a 23 y.o. female with 4 previous suicide attempt via overdose presents to the emergency department after an intentional medication overdose which occurred approximately 40 minutes before arrival.  Patient states that she took 5 tablets of verapamil 240 mg of capsules "to kill myself". It was discussed by Dr. Manson Passey the patient will be admitted to medical services and to follow-up with her once she gets onto the floor of medicine.

## 2018-08-04 NOTE — ED Notes (Signed)
This RN contacted poison control. Poison control nurse faxed over guidelines to this RN. Copy of guidelines placed in pt chart.
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
## 2018-08-04 NOTE — ED Notes (Signed)
Attempted report. Danielle "charge nurse" reports everyone is still in report and she has not assigned that patient to anyone as of yet. Requested I call back in 30 minutes.
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
## 2018-08-05 ENCOUNTER — Other Ambulatory Visit: Payer: Self-pay

## 2018-08-05 ENCOUNTER — Encounter: Payer: Self-pay | Admitting: Behavioral Health

## 2018-08-05 ENCOUNTER — Inpatient Hospital Stay
Admission: AD | Admit: 2018-08-05 | Discharge: 2018-08-08 | DRG: 885 | Disposition: A | Discharge status: Home / Self Care | Payer: 59 | Source: Intra-hospital | Attending: Psychiatry | Admitting: Psychiatry

## 2018-08-05 DIAGNOSIS — Z8249 Family history of ischemic heart disease and other diseases of the circulatory system: Secondary | ICD-10-CM

## 2018-08-05 DIAGNOSIS — F419 Anxiety disorder, unspecified: Secondary | ICD-10-CM | POA: Diagnosis present

## 2018-08-05 DIAGNOSIS — Z915 Personal history of self-harm: Secondary | ICD-10-CM

## 2018-08-05 DIAGNOSIS — Z881 Allergy status to other antibiotic agents status: Secondary | ICD-10-CM

## 2018-08-05 DIAGNOSIS — Z880 Allergy status to penicillin: Secondary | ICD-10-CM

## 2018-08-05 DIAGNOSIS — T1491XA Suicide attempt, initial encounter: Secondary | ICD-10-CM | POA: Diagnosis present

## 2018-08-05 DIAGNOSIS — F4310 Post-traumatic stress disorder, unspecified: Secondary | ICD-10-CM | POA: Diagnosis present

## 2018-08-05 DIAGNOSIS — F339 Major depressive disorder, recurrent, unspecified: Principal | ICD-10-CM | POA: Diagnosis present

## 2018-08-05 DIAGNOSIS — Z882 Allergy status to sulfonamides status: Secondary | ICD-10-CM | POA: Diagnosis not present

## 2018-08-05 DIAGNOSIS — Z91018 Allergy to other foods: Secondary | ICD-10-CM | POA: Diagnosis not present

## 2018-08-05 DIAGNOSIS — Z803 Family history of malignant neoplasm of breast: Secondary | ICD-10-CM | POA: Diagnosis not present

## 2018-08-05 DIAGNOSIS — G4700 Insomnia, unspecified: Secondary | ICD-10-CM | POA: Diagnosis present

## 2018-08-05 DIAGNOSIS — Z818 Family history of other mental and behavioral disorders: Secondary | ICD-10-CM

## 2018-08-05 DIAGNOSIS — F329 Major depressive disorder, single episode, unspecified: Secondary | ICD-10-CM | POA: Diagnosis present

## 2018-08-05 DIAGNOSIS — Z808 Family history of malignant neoplasm of other organs or systems: Secondary | ICD-10-CM

## 2018-08-05 DIAGNOSIS — J45909 Unspecified asthma, uncomplicated: Secondary | ICD-10-CM | POA: Diagnosis present

## 2018-08-05 DIAGNOSIS — Z8349 Family history of other endocrine, nutritional and metabolic diseases: Secondary | ICD-10-CM

## 2018-08-05 DIAGNOSIS — R45851 Suicidal ideations: Secondary | ICD-10-CM | POA: Diagnosis present

## 2018-08-05 DIAGNOSIS — Z888 Allergy status to other drugs, medicaments and biological substances status: Secondary | ICD-10-CM

## 2018-08-05 LAB — BASIC METABOLIC PANEL
Anion gap: 9 (ref 5–15)
Anion gap: 9 (ref 5–15)
BUN: 14 mg/dL (ref 6–20)
BUN: 14 mg/dL (ref 6–20)
CO2: 24 mmol/L (ref 22–32)
CO2: 25 mmol/L (ref 22–32)
Calcium: 8.7 mg/dL — ABNORMAL LOW (ref 8.9–10.3)
Calcium: 9.2 mg/dL (ref 8.9–10.3)
Chloride: 108 mmol/L (ref 98–111)
Chloride: 106 mmol/L (ref 98–111)
Creatinine, Ser: 0.71 mg/dL (ref 0.44–1.00)
Creatinine, Ser: 0.91 mg/dL (ref 0.44–1.00)
GFR calc Af Amer: >60 mL/min (ref >60)
GFR calc Af Amer: >60 mL/min (ref >60)
GFR calc non Af Amer: >60 mL/min (ref >60)
GFR calc non Af Amer: >60 mL/min (ref >60)
Glucose, Bld: 90 mg/dL (ref 70–99)
Glucose, Bld: 112 mg/dL — ABNORMAL HIGH (ref 70–99)
Potassium: 4 mmol/L (ref 3.5–5.1)
Potassium: 3.8 mmol/L (ref 3.5–5.1)
Sodium: 141 mmol/L (ref 135–145)
Sodium: 140 mmol/L (ref 135–145)

## 2018-08-05 LAB — URINALYSIS, COMPLETE (UACMP) WITH MICROSCOPIC
Bacteria, UA: NONE SEEN
Bilirubin Urine: NEGATIVE
Glucose, UA: NEGATIVE mg/dL
Hgb urine dipstick: NEGATIVE
Ketones, ur: NEGATIVE mg/dL
Nitrite: NEGATIVE
Protein, ur: NEGATIVE mg/dL
Specific Gravity, Urine: 1.01 (ref 1.005–1.030)
pH: 7 (ref 5.0–8.0)

## 2018-08-05 LAB — HIV ANTIBODY (ROUTINE TESTING W REFLEX): HIV Screen 4th Generation wRfx: NONREACTIVE

## 2018-08-05 MED ORDER — ALBUTEROL SULFATE (2.5 MG/3ML) 0.083% IN NEBU: 2.5000 mg | INHALATION_SOLUTION | RESPIRATORY_TRACT | Status: DC | PRN

## 2018-08-05 MED ORDER — ALUM & MAG HYDROXIDE-SIMETH 200-200-20 MG/5ML PO SUSP: 30.0000 mL | ORAL | Status: DC | PRN

## 2018-08-05 MED ORDER — ZIPRASIDONE MESYLATE 20 MG IM SOLR: 20.0000 mg | INTRAMUSCULAR | Status: DC | PRN

## 2018-08-05 MED ORDER — KETOROLAC TROMETHAMINE 10 MG PO TABS
10.0000 mg | ORAL_TABLET | Freq: Four times a day (QID) | ORAL | Status: DC | PRN
  Filled 2018-08-05: qty 1

## 2018-08-05 MED ORDER — LORAZEPAM 1 MG PO TABS: 1.0000 mg | ORAL_TABLET | ORAL | Status: DC | PRN

## 2018-08-05 MED ORDER — MAGNESIUM HYDROXIDE 400 MG/5ML PO SUSP: 30.0000 mL | Freq: Every day | ORAL | Status: DC | PRN

## 2018-08-05 MED ORDER — ACETAMINOPHEN 325 MG PO TABS: 650.0000 mg | ORAL_TABLET | Freq: Four times a day (QID) | ORAL | Status: DC | PRN

## 2018-08-05 MED ORDER — IBUPROFEN 400 MG PO TABS: 400.0000 mg | ORAL_TABLET | Freq: Four times a day (QID) | ORAL | 0 refills | Status: AC | PRN

## 2018-08-05 MED ORDER — SERTRALINE HCL 100 MG PO TABS
200.0000 mg | ORAL_TABLET | Freq: Every day | ORAL | Status: DC
  Administered 2018-08-05 – 2018-08-08 (×4): 200 mg via ORAL
  Filled 2018-08-05 (×4): qty 2

## 2018-08-05 MED ORDER — FLUTICASONE PROPIONATE 50 MCG/ACT NA SUSP
2.0000 | Freq: Every day | NASAL | Status: DC
  Administered 2018-08-05 – 2018-08-08 (×4): 2 via NASAL
  Filled 2018-08-05: qty 16

## 2018-08-05 MED ORDER — TRAZODONE HCL 50 MG PO TABS: 25.0000 mg | ORAL_TABLET | Freq: Every evening | ORAL | Status: DC | PRN

## 2018-08-05 MED ORDER — PRAZOSIN HCL 1 MG PO CAPS
1.0000 mg | ORAL_CAPSULE | Freq: Every day | ORAL | Status: DC
  Administered 2018-08-05: 1 mg via ORAL
  Filled 2018-08-05: qty 1

## 2018-08-05 MED ORDER — VITAMIN C 500 MG PO TABS
1000.0000 mg | ORAL_TABLET | Freq: Every day | ORAL | Status: DC
  Administered 2018-08-05 – 2018-08-08 (×4): 1000 mg via ORAL
  Filled 2018-08-05 (×5): qty 2

## 2018-08-05 MED ORDER — LORAZEPAM 0.5 MG PO TABS: 0.5000 mg | ORAL_TABLET | ORAL | Status: DC | PRN

## 2018-08-05 MED ORDER — ADULT MULTIVITAMIN W/MINERALS CH
1.0000 | ORAL_TABLET | Freq: Every day | ORAL | Status: DC
  Administered 2018-08-05 – 2018-08-08 (×4): 1 via ORAL
  Filled 2018-08-05 (×4): qty 1

## 2018-08-05 MED ORDER — ONDANSETRON 4 MG PO TBDP: 4.0000 mg | ORAL_TABLET | Freq: Every day | ORAL | Status: DC | PRN

## 2018-08-05 MED ORDER — BUPROPION HCL ER (XL) 150 MG PO TB24
150.0000 mg | ORAL_TABLET | ORAL | Status: DC
  Administered 2018-08-06 – 2018-08-08 (×3): 150 mg via ORAL
  Filled 2018-08-05 (×3): qty 1

## 2018-08-05 MED ORDER — VALACYCLOVIR HCL 500 MG PO TABS
500.0000 mg | ORAL_TABLET | Freq: Two times a day (BID) | ORAL | Status: DC
  Administered 2018-08-05: 500 mg via ORAL
  Filled 2018-08-05 (×2): qty 1

## 2018-08-05 MED ORDER — MAGNESIUM OXIDE 400 (241.3 MG) MG PO TABS
400.0000 mg | ORAL_TABLET | Freq: Every day | ORAL | Status: DC
  Administered 2018-08-05 – 2018-08-08 (×4): 400 mg via ORAL
  Filled 2018-08-05 (×4): qty 1

## 2018-08-05 MED ORDER — IBUPROFEN 200 MG PO TABS
400.0000 mg | ORAL_TABLET | Freq: Four times a day (QID) | ORAL | Status: DC | PRN
  Administered 2018-08-06: 400 mg via ORAL
  Filled 2018-08-05: qty 2

## 2018-08-05 MED ORDER — PROCHLORPERAZINE MALEATE 10 MG PO TABS
10.0000 mg | ORAL_TABLET | Freq: Every evening | ORAL | Status: DC | PRN
  Filled 2018-08-05: qty 1

## 2018-08-05 MED ORDER — MONTELUKAST SODIUM 10 MG PO TABS
10.0000 mg | ORAL_TABLET | Freq: Every day | ORAL | Status: DC
  Administered 2018-08-05 – 2018-08-07 (×3): 10 mg via ORAL
  Filled 2018-08-05 (×3): qty 1

## 2018-08-05 MED ORDER — LEVOCETIRIZINE DIHYDROCHLORIDE 5 MG PO TABS: 5.0000 mg | ORAL_TABLET | Freq: Every day | ORAL | Status: DC

## 2018-08-05 MED ORDER — LORATADINE 10 MG PO TABS
10.0000 mg | ORAL_TABLET | Freq: Every day | ORAL | Status: DC
  Administered 2018-08-05 – 2018-08-08 (×4): 10 mg via ORAL
  Filled 2018-08-05 (×5): qty 1

## 2018-08-05 MED ORDER — DIPHENHYDRAMINE HCL 25 MG PO CAPS: 25.0000 mg | ORAL_CAPSULE | Freq: Four times a day (QID) | ORAL | Status: DC | PRN

## 2018-08-05 MED ORDER — ELETRIPTAN HYDROBROMIDE 40 MG PO TABS
40.0000 mg | ORAL_TABLET | Freq: Every day | ORAL | Status: DC | PRN
  Filled 2018-08-05: qty 1

## 2018-08-05 MED ORDER — SERTRALINE HCL 100 MG PO TABS: 200.0000 mg | ORAL_TABLET | Freq: Every day | ORAL | Status: DC

## 2018-08-05 MED ORDER — RISPERIDONE 1 MG PO TBDP
2.0000 mg | ORAL_TABLET | Freq: Three times a day (TID) | ORAL | Status: DC | PRN
  Filled 2018-08-05: qty 2

## 2018-08-05 MED ORDER — MELATONIN 5 MG PO TABS
5.0000 mg | ORAL_TABLET | Freq: Every day | ORAL | Status: DC
  Administered 2018-08-05 – 2018-08-07 (×3): 5 mg via ORAL
  Filled 2018-08-05 (×5): qty 1

## 2018-08-05 MED ORDER — VALACYCLOVIR HCL 500 MG PO TABS
500.0000 mg | ORAL_TABLET | Freq: Two times a day (BID) | ORAL | Status: DC
  Administered 2018-08-05 – 2018-08-08 (×4): 500 mg via ORAL
  Filled 2018-08-05 (×7): qty 1
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
## 2018-08-05 NOTE — BH Assessment (Signed)
Patient is to be admitted to [facility] by Dr. Viviano Simas.  Attending Physician will be Dr. Toni Amend.   Patient has been assigned to room 306, by [facility] Charge Nurse S[place].

## 2018-08-05 NOTE — Progress Notes (Signed)
   08/05/18 1600  Clinical Encounter Type  Visited With Patient;Health care provider  Visit Type Follow-up  Ch helped the pt complete an AD. The finished copy can be found in the pt's chart.

## 2018-08-05 NOTE — Discharge Summary (Signed)
[facility] [place] at [facility]   PATIENT NAME: Christina Hill    MR#:  [id number]  DATE OF BIRTH:  [birth year]  DATE OF ADMISSION:  08/03/2018   ADMITTING PHYSICIAN: Hannah BeatJan A Mansy, MD  DATE OF DISCHARGE: 08/05/2018  PRIMARY CARE PHYSICIAN: Marina GoodellFeldpausch, Dale E, MD   ADMISSION DIAGNOSIS:   Suicide attempt (HCC) [T14.91XA] Calcium channel blocker overdose, intentional self-harm, initial encounter (HCC) [T46.1X2A]  DISCHARGE DIAGNOSIS:   Principal Problem:   Calcium channel blocker overdose Active Problems:   PTSD (post-traumatic stress disorder)   Panic attacks   Severe recurrent major depression without psychotic features (HCC)   SECONDARY DIAGNOSIS:   Past Medical History:  Diagnosis Date  . Allergy    seasonal allergies  . Anxiety   . Asthma    seasonal asthma  . Depression   . Herpes genitalis in women    type 1   . PTSD (post-traumatic stress disorder)     HOSPITAL COURSE:   23 year old female with depression, anxiety, PTSD, migraine, asthma presents to hospital secondary to overdosing on verapamil.  1.  Major depressive disorder with drug overdose-suicidal ideation.  With overdose on 5 tablets of 240 mg of verapamil- > 36 hours ago. -Poison center contacted, case close now is more than 24 hours with stable and no symptoms. -Blood pressure is stable.  Not bradycardic - Patient received oral charcoal in the ED -Psychiatry has been consulted.  Suicide precautions, sitter in the room -Will need inpatient psych admission after medical clearance.  Transfer to psychiatry today is medically stable  2.  Acute renal failure and hyperkalemia-resolved with fluids.  3.  Lactic acidosis-improved with IV fluids  4.  Asthma-stable.   restart inhalers as needed  5.  Depression anxiety-currently on Wellbutrin, Zoloft and Ativan.  Restart her home medications.  Further management per psychiatrist.  6.    Migraine headaches-ibuprofen as needed.   Restarted verapamil.  Independent at baseline  DISCHARGE CONDITIONS:   Guarded  CONSULTS OBTAINED:   Treatment Team:  Mariel CraftMaurer, Sheila M, MD  DRUG ALLERGIES:   Allergies  Allergen Reactions  . Amoxicillin Anaphylaxis  . Cephalosporins Anaphylaxis  . Penicillins Anaphylaxis and Hives  . Sodium Hypochlorite Anaphylaxis  . Sulfa Antibiotics Hives and Anaphylaxis  . Cucumber Extract Nausea And Vomiting  . Almond (Diagnostic)     Abdominal cramping   DISCHARGE MEDICATIONS:   Allergies as of 08/05/2018      Reactions   Amoxicillin Anaphylaxis   Cephalosporins Anaphylaxis   Penicillins Anaphylaxis, Hives   Sodium Hypochlorite Anaphylaxis   Sulfa Antibiotics Hives, Anaphylaxis   Cucumber Extract Nausea And Vomiting   Almond (diagnostic)    Abdominal cramping      Medication List    STOP taking these medications   Biotin [id number] MCG Tabs   diphenhydrAMINE 25 mg capsule Commonly known as:  BENADRYL   ketorolac 10 MG tablet Commonly known as:  TORADOL   levocetirizine 5 MG tablet Commonly known as:  XYZAL   Nexplanon 68 MG Impl implant Generic drug:  etonogestrel   ondansetron 4 MG disintegrating tablet Commonly known as:  ZOFRAN-ODT   Thiamine 50 MG Caps   vitamin C 1000 MG tablet     TAKE these medications   buPROPion 150 MG 24 hr tablet Commonly known as:  Wellbutrin XL Take 1 tablet (150 mg total) by mouth every morning.   eletriptan 40 MG tablet Commonly known as:  RELPAX Take 40 mg by mouth daily as needed  for headache.   fluticasone 50 MCG/ACT nasal spray Commonly known as:  FLONASE Place 2 sprays into both nostrils daily.   ibuprofen 400 MG tablet Commonly known as:  ADVIL Take 1 tablet (400 mg total) by mouth every 6 (six) hours as needed for headache.   LORazepam 0.5 MG tablet Commonly known as:  ATIVAN Take 0.5 mg by mouth as needed for anxiety.   magnesium oxide 400 MG tablet Commonly known as:  MAG-OX Take 400 mg by mouth daily.    Melatonin Maximum Strength 5 MG Tabs Generic drug:  Melatonin Take 5 mg by mouth at bedtime.   montelukast 10 MG tablet Commonly known as:  SINGULAIR Take 10 mg by mouth daily.   multivitamin with minerals tablet Take 1 tablet by mouth daily.   prochlorperazine 10 MG tablet Commonly known as:  COMPAZINE Take 10 mg by mouth at bedtime as needed. With diphenhydramine for rescue sleep during severe migraine attack. Max three times a week.   Proventil HFA 108 (90 Base) MCG/ACT inhaler Generic drug:  albuterol Inhale into the lungs every 6 (six) hours as needed for wheezing or shortness of breath.   sertraline 100 MG tablet Commonly known as:  ZOLOFT Take 2 tablets (200 mg total) by mouth daily. Start taking on:  August 06, 2018 What changed:  how much to take   valACYclovir 500 MG tablet Commonly known as:  VALTREX Take 500 mg by mouth 2 (two) times daily.   verapamil 240 MG 24 hr capsule Commonly known as:  VERELAN PM Take 240 mg by mouth at bedtime.   vitamin B-12 1000 MCG tablet Commonly known as:  CYANOCOBALAMIN Take 1,000 mcg by mouth daily.        DISCHARGE INSTRUCTIONS:   1.  Transfer to behavioral medicine unit  DIET:   Regular diet  ACTIVITY:   Activity as tolerated  OXYGEN:   Home Oxygen: No.  Oxygen Delivery: room air  DISCHARGE LOCATION:   Behavioral medicine unit  If you experience worsening of your admission symptoms, develop shortness of breath, life threatening emergency, suicidal or homicidal thoughts you must seek medical attention immediately by calling 911 or calling your MD immediately  if symptoms less severe.  You Must read complete instructions/literature along with all the possible adverse reactions/side effects for all the Medicines you take and that have been prescribed to you. Take any new Medicines after you have completely understood and accpet all the possible adverse reactions/side effects.   Please note  You were cared for  by a hospitalist during your hospital stay. If you have any questions about your discharge medications or the care you received while you were in the hospital after you are discharged, you can call the unit and asked to speak with the hospitalist on call if the hospitalist that took care of you is not available. Once you are discharged, your primary care physician will handle any further medical issues. Please note that NO REFILLS for any discharge medications will be authorized once you are discharged, as it is imperative that you return to your primary care physician (or establish a relationship with a primary care physician if you do not have one) for your aftercare needs so that they can reassess your need for medications and monitor your lab values.    On the day of Discharge:  VITAL SIGNS:   Blood pressure 125/65, pulse (!) 104, temperature 98.7 F (37.1 C), resp. rate 18, height  (1.651 m), weight Marland Kitchen)  149.2 kg, SpO2 96 %.  PHYSICAL EXAMINATION:     GENERAL:  23 y.o.-year-old  morbidly obese patient lying in the bed with no acute distress.  EYES: Pupils equal, round, reactive to light and accommodation. No scleral icterus. Extraocular muscles intact.  HEENT: Head atraumatic, normocephalic. Oropharynx and nasopharynx clear.  NECK:  Supple, no jugular venous distention. No thyroid enlargement, no tenderness.  LUNGS: Normal breath sounds bilaterally, no wheezing, rales,rhonchi or crepitation. No use of accessory muscles of respiration.  Decreased bibasilar breath sounds CARDIOVASCULAR: S1, S2 normal. No murmurs, rubs, or gallops.  ABDOMEN: Soft, nontender, nondistended. Bowel sounds present. No organomegaly or mass.  EXTREMITIES: No pedal edema, cyanosis, or clubbing.  NEUROLOGIC: Cranial nerves II through XII are intact. Muscle strength 5/5 in all extremities. Sensation intact. Gait not checked.  PSYCHIATRIC: The patient is alert and oriented x 3.  SKIN: No obvious rash, lesion, or  ulcer.Marland Kitchen   DATA REVIEW:   CBC Recent Labs  Lab 08/04/18 0846  WBC 12.2*  HGB 12.1  HCT 37.5  PLT 298    Chemistries  Recent Labs  Lab 08/03/18 2307  08/05/18 0455  NA 139   < > 141  K 5.2*   < > 4.0  CL 107   < > 108  CO2 23   < > 24  GLUCOSE 145*   < > 90  BUN 13   < > 14  CREATININE 1.05*   < > 0.71  CALCIUM 9.1   < > 8.7*  AST 33  --   --   ALT 18  --   --   ALKPHOS 57  --   --   BILITOT 1.0  --   --    < > = values in this interval not displayed.     Microbiology Results  Results for orders placed or performed during the hospital encounter of 08/03/18  SARS Coronavirus 2 (CEPHEID - Performed in [facility] hospital lab), Hosp Order     Status: None   Collection Time: 08/04/18  1:37 AM  Result Value Ref Range Status   SARS Coronavirus 2 NEGATIVE NEGATIVE Final    Comment: (NOTE) If result is NEGATIVE SARS-CoV-2 target nucleic acids are NOT DETECTED. The SARS-CoV-2 RNA is generally detectable in upper and lower  respiratory specimens during the acute phase of infection. The lowest  concentration of SARS-CoV-2 viral copies this assay can detect is 250  copies / mL. A negative result does not preclude SARS-CoV-2 infection  and should not be used as the sole basis for treatment or other  patient management decisions.  A negative result may occur with  improper specimen collection / handling, submission of specimen other  than nasopharyngeal swab, presence of viral mutation(s) within the  areas targeted by this assay, and inadequate number of viral copies  (<250 copies / mL). A negative result must be combined with clinical  observations, patient history, and epidemiological information. If result is POSITIVE SARS-CoV-2 target nucleic acids are DETECTED. The SARS-CoV-2 RNA is generally detectable in upper and lower  respiratory specimens dur ing the acute phase of infection.  Positive  results are indicative of active infection with SARS-CoV-2.  Clinical   correlation with patient history and other diagnostic information is  necessary to determine patient infection status.  Positive results do  not rule out bacterial infection or co-infection with other viruses. If result is PRESUMPTIVE POSTIVE SARS-CoV-2 nucleic acids MAY BE PRESENT.   A presumptive positive result was  obtained on the submitted specimen  and confirmed on repeat testing.  While 2019 novel coronavirus  (SARS-CoV-2) nucleic acids may be present in the submitted sample  additional confirmatory testing may be necessary for epidemiological  and / or clinical management purposes  to differentiate between  SARS-CoV-2 and other Sarbecovirus currently known to infect humans.  If clinically indicated additional testing with an alternate test  methodology [id number]) is advised. The SARS-CoV-2 RNA is generally  detectable in upper and lower respiratory sp ecimens during the acute  phase of infection. The expected result is Negative. Fact Sheet for Patients:  BoilerBrush.com.cy Fact Sheet for Healthcare Providers: https://pope.com/ This test is not yet approved or cleared by the [place] FDA and has been authorized for detection and/or diagnosis of SARS-CoV-2 by FDA under an Emergency Use Authorization (EUA).  This EUA will remain in effect (meaning this test can be used) for the duration of the COVID-19 declaration under Section 564(b)(1) of the Act, 21 U.S.C. section 360bbb-3(b)(1), unless the authorization is terminated or revoked sooner. Performed at [facility], [address] [place], [place] [address]     RADIOLOGY:  No results found.   Management plans discussed with the patient, family and they are in agreement.  CODE STATUS:     Code Status Orders  (From admission, onward)         Start     Ordered   08/04/18 0148  Full code  Continuous     08/04/18 0152        Code Status History    This  patient has a current code status but no historical code status.    Advance Directive Documentation     Most Recent Value  Type of Advance Directive  Healthcare Power of Attorney, Living will  Pre-existing out of facility DNR order (yellow form or pink MOST form)  -  "MOST" Form in Place?  -      TOTAL TIME TAKING CARE OF THIS PATIENT: 39 minutes.    Enid Baas M.D on 08/05/2018 at 10:47 AM  Between 7am to 6pm - Pager - [phone]  After 6pm go to [email or web address] - Social research officer, government  [facility]  Office  [phone]  CC: Primary care physician; Marina Goodell, MD   Note: This dictation was prepared with Dragon dictation along with smaller phrase technology. Any transcriptional errors that result from this process are unintentional.

## 2018-08-05 NOTE — Progress Notes (Addendum)
Patient discharged per MD order. Discharge instructions given and all questions answered. Report given to [place] in [facility]. Will escort to [facility] with security.

## 2018-08-06 MED ORDER — PRAZOSIN HCL 2 MG PO CAPS
2.0000 mg | ORAL_CAPSULE | Freq: Every day | ORAL | Status: DC
  Administered 2018-08-06 – 2018-08-07 (×2): 2 mg via ORAL
  Filled 2018-08-06 (×2): qty 1

## 2018-08-06 NOTE — Plan of Care (Signed)
  Problem: Education: Goal: Knowledge of [facility] Education information/materials will improve Outcome: Progressing Goal: Emotional status will improve Outcome: Progressing Goal: Mental status will improve Outcome: Progressing Goal: Verbalization of understanding the information provided will improve Outcome: Progressing   D: Patient has been pleasant and cooperative. Denies SI, HI and AV hallucinations. Medication compliant. Mood is pleasant. Affect is sad. A: Continue to monitor for safety. R: Safety maintained.

## 2018-08-06 NOTE — Plan of Care (Signed)
Patient instructed on [facility] Education, unit programing , patient able to verbalize  understanding.  Patient interacting  with staff and peers . Marland Kitchen Compliant  wit medication regiment , verbalize  understanding . Encourage  group process to improve  thought process .  Working on coping , decision making  and anxiety  Denies suicidal ideation .  Voice of no concerns around sleep  Problem: Education: Goal: Knowledge of [facility] Education information/materials will improve Outcome: Progressing Goal: Emotional status will improve Outcome: Progressing Goal: Mental status will improve Outcome: Progressing Goal: Verbalization of understanding the information provided will improve Outcome: Progressing   Problem: Health Behavior/Discharge Planning: Goal: Identification of resources available to assist in meeting health care needs will improve Outcome: Progressing   Problem: Self-Concept: Goal: Will verbalize positive feelings about self Outcome: Progressing   Problem: Activity: Goal: Interest or engagement in leisure activities will improve Outcome: Progressing Goal: Imbalance in normal sleep/wake cycle will improve Outcome: Progressing   Problem: Activity: Goal: Interest or engagement in leisure activities will improve Outcome: Progressing Goal: Imbalance in normal sleep/wake cycle will improve Outcome: Progressing   Problem: Activity: Goal: Interest or engagement in leisure activities will improve Outcome: Progressing Goal: Imbalance in normal sleep/wake cycle will improve Outcome: Progressing
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
## 2018-08-06 NOTE — [facility] Suicide Risk Assessment (Signed)
[facility] Admission Suicide Risk Assessment   Nursing information obtained from:  Patient Demographic factors:  Caucasian Current Mental Status:  Self-harm thoughts, Self-harm behaviors Loss Factors:  NA Historical Factors:  Impulsivity, Prior suicide attempts Risk Reduction Factors:  Positive social support, Positive coping skills or problem solving skills, Living with another person, especially a relative  Total Time spent with patient: 45 minutes Principal Problem: Overdose/PTSD/chronic depression Diagnosis:  Active Problems:   Suicide attempt ([place])  Subjective Data: Describes overdose as impulsive reaction to flashback  Continued Clinical Symptoms:  Alcohol Use Disorder Identification Test Final Score (AUDIT): 0 The "Alcohol Use Disorders Identification Test", Guidelines for Use in Primary Care, Second Edition.  World Pharmacologist [facility]). Score between 0-7:  no or low risk or alcohol related problems. Score between 8-15:  moderate risk of alcohol related problems. Score between 16-19:  high risk of alcohol related problems. Score 20 or above:  warrants further diagnostic evaluation for alcohol dependence and treatment.   CLINICAL FACTORS:   Dysthymia  Musculoskeletal: Strength & Muscle Tone: within normal limits Gait & Station: normal Patient leans: N/A  Psychiatric Specialty Exam: Physical Exam cardiovascular system stable despite recent overdose medically stable at this point in time  ROS obesity on constitution/cardiovascular hypertensive at times/endocrine negative for thyroid disease  Blood pressure 118/82, pulse (!) 102, temperature 98.2 F (36.8 C), temperature source Oral, resp. rate 18, height 5\' 5"  (1.651 m), weight (!) 142.9 kg, SpO2 96 %.Body mass index is 52.42 kg/m.  General Appearance: Casual  Eye Contact:  Good  Speech:  Clear and Coherent  Volume:  Normal  Mood:  Anxious and Depressed  Affect:  Appropriate and Blunt  Thought Process:  Coherent and  associations intact  Orientation:  Full (Time, Place, and Person)  Thought Content:  Logical and Tangential  Suicidal Thoughts: Despite recent overdose and past attempts denies current thoughts of harming self or others  Homicidal Thoughts:  No  Memory:  Immediate;   Good  Judgement:  Good  Insight:  Good  Psychomotor Activity:  Normal  Concentration:  Concentration: Good  Recall:  Good  Fund of Knowledge:  Good  Language:  Good  Akathisia:  Negative  Handed:  Right  AIMS (if indicated):     Assets:  Communication Skills Desire for Improvement  ADL's:  Intact  Cognition:  WNL  Sleep:  Number of Hours: 6.75      COGNITIVE FEATURES THAT CONTRIBUTE TO RISK:  None    SUICIDE RISK:   Moderate:  Frequent suicidal ideation with limited intensity, and duration, some specificity in terms of plans, no associated intent, good self-control, limited dysphoria/symptomatology, some risk factors present, and identifiable protective factors, including available and accessible social support.  PLAN OF CARE: Requesting no medication changes continue current precautions  I certify that inpatient services furnished can reasonably be expected to improve the patient's condition.   [name], MD 08/06/2018, 9:48 AM

## 2018-08-06 NOTE — B[facility] Group Notes (Signed)
LCSW Group Therapy Note  08/06/2018 1:15pm  Type of Therapy and Topic:  Group Therapy:  Healthy Self Image and Positive Change  Participation Level:  Active   Description of Group:  In this group, patients will compare and contrast their current "I am...." statements to the visions they identify as desirable for their lives.  Patients discuss fears and how they can make positive changes in their cognitions that will positively impact their behaviors.  Facilitator played a motivational 3-minute speech and patients were left with the task of thinking about what "I am...." statements they can start using in their lives immediately.  Therapeutic Goals: 1. Patient will state their current self-perception as expressed in an "I Am" statement 2. Patient will contrast this with their desired vision for their live 3. Patient will identify 3 fears that negatively impact their behavior 4. Patient will discuss cognitive distortions that stem from their fears 5. Patient will verbalize statements that challenge their cognitive distortions  Summary of Patient Progress:  The patient scored her mood at a 7 (10 best.) The patient stated, "I am determined" Patient discussed her fears and how he can make positive changes in their cognitions that will positively impact her behaviors. Patient was able to discuss and process cognitive distortions that stem from her fears. Patient actively and appropriately engaged in the group. Patient was able to provide support and validation to other group members. Patient practiced active listening when interacting with the facilitator and other group members.      Therapeutic Modalities Cognitive Behavioral Therapy Motivational Interviewing  Phelan Schadt  CUEBAS-COLON, LCSW 08/06/2018 12:54 PM
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
## 2018-08-06 NOTE — Progress Notes (Addendum)
`  D: Patient stated slept fair last night .Stated appetite is good and energy level low. Stated concentration good . Stated on Depression scale 5 , hopeless 0 and anxiety 6 .( low 0-10 high) Denies suicidal  homicidal ideations  . Denies auditory hallucinations  No pain concerns . Appropriate ADL'S. Interacting with peers and staff. Patient instructed on [facility] Education, unit programing , patient able to verbalize  understanding.  Patient interacting  with staff and peers . Marland Kitchen Compliant  wit medication regiment , verbalize  understanding . Encourage  group process to improve  thought process .  Working on coping , decision making  and anxiety   Patient looking at  Discharge planing  A: Encourage patient participation with unit programming . Instruction  Given on  Medication , verbalize understanding.  R: Voice no other concerns. Staff continue to monitor

## 2018-08-07 MED ORDER — CLONIDINE HCL 0.1 MG PO TABS: 0.2000 mg | ORAL_TABLET | Freq: Three times a day (TID) | ORAL | Status: DC | PRN

## 2018-08-07 MED ORDER — SUMATRIPTAN SUCCINATE 50 MG PO TABS
50.0000 mg | ORAL_TABLET | ORAL | Status: DC | PRN
  Administered 2018-08-07: 50 mg via ORAL
  Filled 2018-08-07: qty 1

## 2018-08-07 MED ORDER — VERAPAMIL HCL ER 240 MG PO TBCR
240.0000 mg | EXTENDED_RELEASE_TABLET | Freq: Every day | ORAL | Status: DC
  Administered 2018-08-07 – 2018-08-08 (×2): 240 mg via ORAL
  Filled 2018-08-07 (×2): qty 1
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
## 2018-08-07 NOTE — Progress Notes (Signed)
[facility] MD Progress Note  08/07/2018 8:52 AM Christina Hill  MRN:  [id number] Subjective:   Patient remains generally to herself and in bed and less engaged in the milieu however her story is consistent.  She again insists she just had a bad moment she is not currently suicidal she denies wanting to harm self or others and she can contract for safety.  She is requesting her riboflavin, vitamin B2 but that is not on formulary.  And her blood pressure is up so now that she is clear for overdose we can resume her verapamil at its usual dosing.  Continue cognitive therapy.  Principal Problem: PTSD/borderline traits versus disorder/chronic depression Diagnosis: Active Problems:   Suicide attempt ([place])  Total Time spent with patient: 20 minutes Past Medical History:  Past Medical History:  Diagnosis Date  . Allergy    seasonal allergies  . Anxiety   . Asthma    seasonal asthma  . Depression   . Herpes genitalis in women    type 1   . PTSD (post-traumatic stress disorder)     Past Surgical History:  Procedure Laterality Date  . ACNE CYST REMOVAL     back- benign  . ingrown toenail     removal   Family History:  Family History  Problem Relation Age of Onset  . Depression Mother   . Hypertension Mother   . Hyperlipidemia Mother   . Depression Father   . Depression Brother   . Hypertension Brother   . Thyroid cancer Maternal Aunt   . Breast cancer Maternal Aunt   . Depression Maternal Aunt   . Hypertension Maternal Uncle   . Hypertension Paternal Uncle   . Heart attack Paternal Grandfather   . Heart disease Paternal Grandfather   . Hypertension Paternal Grandfather     Social History:  Social History   Substance and Sexual Activity  Alcohol Use Yes  . Alcohol/week: 1.0 standard drinks  . Types: 1 Glasses of wine per week   Comment: 0.5 of wine weekly     Social History   Substance and Sexual Activity  Drug Use No    Social History   Socioeconomic History  .  Marital status: Married    Spouse name: Not on file  . Number of children: Not on file  . Years of education: Not on file  . Highest education level: Not on file  Occupational History  . Not on file  Social Needs  . Financial resource strain: Not on file  . Food insecurity:    Worry: Not on file    Inability: Not on file  . Transportation needs:    Medical: Not on file    Non-medical: Not on file  Tobacco Use  . Smoking status: Never Smoker  . Smokeless tobacco: Never Used  Substance and Sexual Activity  . Alcohol use: Yes    Alcohol/week: 1.0 standard drinks    Types: 1 Glasses of wine per week    Comment: 0.5 of wine weekly  . Drug use: No  . Sexual activity: Not Currently    Birth control/protection: Implant  Lifestyle  . Physical activity:    Days per week: Not on file    Minutes per session: Not on file  . Stress: Not on file  Relationships  . Social connections:    Talks on phone: Not on file    Gets together: Not on file    Attends religious service: Not on file  Active member of club or organization: Not on file    Attends meetings of clubs or organizations: Not on file    Relationship status: Not on file  Other Topics Concern  . Not on file  Social History Narrative  . Not on file   Additional Social History:                         Sleep: Good  Appetite:  Good  Current Medications: Current Facility-Administered Medications  Medication Dose Route Frequency Provider Last Rate Last Dose  . acetaminophen (TYLENOL) tablet 650 mg  650 mg Oral Q6H PRN Mariel CraftMaurer, Sheila M, MD      . albuterol (PROVENTIL) (2.5 MG/3ML) 0.083% nebulizer solution 2.5 mg  2.5 mg Inhalation Q4H PRN Mariel CraftMaurer, Sheila M, MD      . alum & mag hydroxide-simeth (MAALOX/MYLANTA) 200-200-20 MG/5ML suspension 30 mL  30 mL Oral Q4H PRN Mariel CraftMaurer, Sheila M, MD      . buPROPion (WELLBUTRIN XL) 24 hr tablet 150 mg  150 mg Oral Theodora BlowBH-q7a Maurer, Sheila M, MD   150 mg at 08/07/18 0719  .  cloNIDine (CATAPRES) tablet 0.2 mg  0.2 mg Oral TID PRN Malvin JohnsFarah, Delonte Musich, MD      . diphenhydrAMINE (BENADRYL) capsule 25 mg  25 mg Oral Q6H PRN Mariel CraftMaurer, Sheila M, MD      . eletriptan (RELPAX) tablet 40 mg  40 mg Oral Daily PRN Mariel CraftMaurer, Sheila M, MD      . fluticasone [name](FLONASE) 50 MCG/ACT nasal spray 2 spray  2 spray Each Nare Daily Mariel CraftMaurer, Sheila M, MD   2 spray at 08/06/18 [phone]  . ibuprofen (ADVIL) tablet 400 mg  400 mg Oral Q6H PRN Mariel CraftMaurer, Sheila M, MD   400 mg at 08/06/18 0851  . ketorolac (TORADOL) tablet 10 mg  10 mg Oral Q6H PRN Mariel CraftMaurer, Sheila M, MD      . loratadine (CLARITIN) tablet 10 mg  10 mg Oral Daily Clapacs, [name], MD   10 mg at 08/06/18 0847  . LORazepam (ATIVAN) tablet 0.5 mg  0.5 mg Oral PRN Mariel CraftMaurer, Sheila M, MD      . risperiDONE (RISPERDAL M-TABS) disintegrating tablet 2 mg  2 mg Oral Q8H PRN Mariel CraftMaurer, Sheila M, MD       And  . LORazepam (ATIVAN) tablet 1 mg  1 mg Oral PRN Mariel CraftMaurer, Sheila M, MD       And  . ziprasidone (GEODON) injection 20 mg  20 mg Intramuscular PRN Mariel CraftMaurer, Sheila M, MD      . magnesium hydroxide (MILK OF MAGNESIA) suspension 30 mL  30 mL Oral Daily PRN Mariel CraftMaurer, Sheila M, MD      . magnesium hydroxide (MILK OF MAGNESIA) suspension 30 mL  30 mL Oral Daily PRN Mariel CraftMaurer, Sheila M, MD      . magnesium oxide (MAG-OX) tablet 400 mg  400 mg Oral Daily Mariel CraftMaurer, Sheila M, MD   400 mg at 08/06/18 0847  . Melatonin TABS 5 mg  5 mg Oral QHS Mariel CraftMaurer, Sheila M, MD   5 mg at 08/06/18 2137  . montelukast (SINGULAIR) tablet 10 mg  10 mg Oral Daily Mariel CraftMaurer, Sheila M, MD   10 mg at 08/06/18 2138  . multivitamin with minerals tablet 1 tablet  1 tablet Oral Daily Mariel CraftMaurer, Sheila M, MD   1 tablet at 08/06/18 [id number]  . ondansetron (ZOFRAN-ODT) disintegrating tablet 4 mg  4 mg Oral Daily PRN Gretta CoolMaurer, Sheila  M, MD      . prazosin (MINIPRESS) capsule 2 mg  2 mg Oral QHS Malvin JohnsFarah, Elliana Bal, MD   2 mg at 08/06/18 2137  . prochlorperazine (COMPAZINE) tablet 10 mg  10 mg Oral QHS PRN Mariel CraftMaurer, Sheila M, MD       . sertraline (ZOLOFT) tablet 200 mg  200 mg Oral Daily Mariel CraftMaurer, Sheila M, MD   200 mg at 08/06/18 0847  . traZODone (DESYREL) tablet 25 mg  25 mg Oral QHS PRN Mariel CraftMaurer, Sheila M, MD      . valACYclovir Ralph Dowdy(VALTREX) tablet 500 mg  500 mg Oral BID [name], [name], NP   500 mg at 08/06/18 0847  . verapamil (CALAN-SR) CR tablet 240 mg  240 mg Oral Daily Malvin JohnsFarah, Merry Pond, MD      . vitamin C (ASCORBIC ACID) tablet 1,000 mg  1,000 mg Oral Daily Mariel CraftMaurer, Sheila M, MD   1,000 mg at 08/06/18 0848    Lab Results:  Results for orders placed or performed during the hospital encounter of 08/05/18 (from the past 48 hour(s))  Basic metabolic panel     Status: Abnormal   Collection Time: 08/05/18  4:47 PM  Result Value Ref Range   Sodium 140 135 - 145 mmol/L   Potassium 3.8 3.5 - 5.1 mmol/L   Chloride 106 98 - 111 mmol/L   CO2 25 22 - 32 mmol/L   Glucose, Bld 112 (H) 70 - 99 mg/dL   BUN 14 6 - 20 mg/dL   Creatinine, Ser 9.140.91 0.44 - 1.00 mg/dL   Calcium 9.2 8.9 - 78.210.3 mg/dL   GFR calc non Af Amer >60 >60 mL/min   GFR calc Af Amer >60 >60 mL/min   Anion gap 9 5 - 15    Comment: Performed at [facility], [address] [place], [place] [address]    Blood Alcohol level:  Lab Results  Component Value Date   ETH <10 08/03/2018   ETH <10 10/04/2017    Metabolic Disorder Labs: No results found for: HGBA1C, MPG No results found for: PROLACTIN No results found for: CHOL, TRIG, HDL, CHOLHDL, VLDL, LDLCALC  Physical Findings: AIMS:  , ,  ,  ,    CIWA:    COWS:     Musculoskeletal: Strength & Muscle Tone: within normal limits Gait & Station: normal Patient leans: N/A  Psychiatric Specialty Exam: Physical Exam  ROS  Blood pressure (!) 144/92, pulse (!) 103, temperature 98.3 F (36.8 C), temperature source Oral, resp. rate 18, height 5\' 5"  (1.651 m), weight (!) 142.9 kg, SpO2 100 %.Body mass index is 52.42 kg/m.  General Appearance: Casual  Eye Contact:  imtermittent  Speech:  Clear and  Coherent  Volume:  Normal  Mood:  Dysphoric  Affect:  Blunt  Thought Process:  Goal Directed and Descriptions of Associations: Intact  Orientation:  Full (Time, Place, and Person)  Thought Content:  Rumination  Suicidal Thoughts:  No  Homicidal Thoughts:  No  Memory:  Immediate;   Fair  Judgement:  Fair  Insight:  Good  Psychomotor Activity:  Normal  Concentration:  Concentration: Good  Recall:  Good  Fund of Knowledge:  Good  Language:  Good  Akathisia:  Negative  Handed:  Right  AIMS (if indicated):     Assets:  Communication Skills Desire for Improvement Leisure Time Physical Health Resilience Social Support  ADL's:  Intact  Cognition:  WNL  Sleep:  Number of Hours: 7  Treatment Plan Summary: Daily contact with patient to assess and evaluate symptoms and progress in treatment, Medication management and Plan As above continue cognitive therapy continue milieu therapy resume verapamil add PRN for hypertension continue to monitor and current precautions probable discharge tomorrow  Malvin JohnsFARAH,Kathren Scearce, MD 08/07/2018, 8:52 AM
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
## 2018-08-07 NOTE — Plan of Care (Signed)
D- Patient alert and oriented. Patient presents in a pleasant mood on assessment stating that she slept "great" last night and the only complaint that she had was of a migraine, in which she rated a "6/10", and requested PRN medication for relief. Patient denies depression stating that "I feel good today", however, she did endorse "a little bit" of anxiety "because I'm bored". Patient denies also SI, HI, AVH, at this time. Patient's goal for today is "discharge plan".  A- Scheduled medications administered to patient, per MD orders. Support and encouragement provided.  Routine safety checks conducted every 15 minutes.  Patient informed to notify staff with problems or concerns.  R- No adverse drug reactions noted. Patient contracts for safety at this time. Patient compliant with medications and treatment plan. Patient receptive, calm, and cooperative. Patient interacts well with others on the unit.  Patient remains safe at this time.  Problem: Education: Goal: Knowledge of [facility] Education information/materials will improve Outcome: Progressing Goal: Emotional status will improve Outcome: Progressing Goal: Mental status will improve Outcome: Progressing Goal: Verbalization of understanding the information provided will improve Outcome: Progressing   Problem: Health Behavior/Discharge Planning: Goal: Identification of resources available to assist in meeting health care needs will improve Outcome: Progressing   Problem: Self-Concept: Goal: Will verbalize positive feelings about self Outcome: Progressing   Problem: Activity: Goal: Interest or engagement in leisure activities will improve Outcome: Progressing Goal: Imbalance in normal sleep/wake cycle will improve Outcome: Progressing

## 2018-08-07 NOTE — Progress Notes (Signed)
Patient states that she only takes this medication once a day "unless I'm having an excerbation, that's when I take it twice".

## 2018-08-07 NOTE — BHH Group Notes (Signed)
LCSW Group Therapy Note 08/07/2018 1:15pm  Type of Therapy and Topic: Group Therapy: Feelings Around Returning Home & Establishing a Supportive Framework and Supporting Oneself When Supports Not Available  Participation Level: Active  Description of Group:  Patients first processed thoughts and feelings about upcoming discharge. These included fears of upcoming changes, lack of change, new living environments, judgements and expectations from others and overall stigma of mental health issues. The group then discussed the definition of a supportive framework, what that looks and feels like, and how do to discern it from an unhealthy non-supportive network. The group identified different types of supports as well as what to do when your family/friends are less than helpful or unavailable  Therapeutic Goals  1. Patient will identify one healthy supportive network that they can use at discharge. 2. Patient will identify one factor of a supportive framework and how to tell it from an unhealthy network. 3. Patient able to identify one coping skill to use when they do not have positive supports from others. 4. Patient will demonstrate ability to communicate their needs through discussion and/or role plays.  Summary of Patient Progress:  The patient reported she feels "good." Pt engaged during group session. As patients processed their anxiety about discharge and described healthy supports patient shared she is ready to be discharge. She stated, "I'm in a better place." She listed her family, wife and best friend as her main support system.  Patients identified at least one self-care tool they were willing to use after discharge.   Therapeutic Modalities Cognitive Behavioral Therapy Motivational Interviewing   Nallely Yost  CUEBAS-COLON, LCSW 08/07/2018 11:55 AM

## 2018-08-08 DIAGNOSIS — T1491XA Suicide attempt, initial encounter: Secondary | ICD-10-CM

## 2018-08-08 MED ORDER — PRAZOSIN HCL 2 MG PO CAPS: 2.0000 mg | ORAL_CAPSULE | Freq: Every day | ORAL | 1 refills | Status: DC

## 2018-08-08 MED ORDER — VERAPAMIL HCL ER 240 MG PO TBCR: 240.0000 mg | EXTENDED_RELEASE_TABLET | Freq: Every day | ORAL | 1 refills | Status: AC

## 2018-08-08 NOTE — BHH Suicide Risk Assessment (Signed)
[facility] Discharge Suicide Risk Assessment   Principal Problem: <principal problem not specified> Discharge Diagnoses: Active Problems:   Suicide attempt ([place])   Total Time spent with patient: 45 minutes  Musculoskeletal: Strength & Muscle Tone: within normal limits Gait & Station: normal Patient leans: N/A  Psychiatric Specialty Exam: Review of Systems  Constitutional: Negative.   HENT: Negative.   Eyes: Negative.   Respiratory: Negative.   Cardiovascular: Negative.   Gastrointestinal: Negative.   Musculoskeletal: Negative.   Skin: Negative.   Neurological: Negative.   Psychiatric/Behavioral: Negative for depression, hallucinations, memory loss, substance abuse and suicidal ideas. The patient is nervous/anxious and has insomnia.     Blood pressure 123/76, pulse (!) 104, temperature 98.6 F (37 C), temperature source Oral, resp. rate 18, height 5\' 5"  (1.651 m), weight (!) 142.9 kg, SpO2 99 %.Body mass index is 52.42 kg/m.  General Appearance: Casual  Eye Contact::  Good  Speech:  Normal Rate409  Volume:  Normal  Mood:  Euthymic  Affect:  Congruent  Thought Process:  Goal Directed  Orientation:  Full (Time, Place, and Person)  Thought Content:  Logical  Suicidal Thoughts:  No  Homicidal Thoughts:  No  Memory:  Immediate;   Fair Recent;   Fair Remote;   Fair  Judgement:  Fair  Insight:  Fair  Psychomotor Activity:  Normal  Concentration:  Fair  Recall:  [employer]  Language: Fair  Akathisia:  No  Handed:  Right  AIMS (if indicated):     Assets:  Desire for Improvement Housing Physical Health Resilience  Sleep:  Number of Hours: 8  Cognition: WNL  ADL's:  Intact   Mental Status Per Nursing Assessment::   On Admission:  Self-harm thoughts, Self-harm behaviors  Demographic Factors:  Adolescent or young adult and Gay, lesbian, or bisexual orientation  Loss Factors: Fairly recent traumatic incidents.  Recently processing and reliving  trauma  Historical Factors: Impulsivity  Risk Reduction Factors:   Living with another person, especially a relative, Positive social support, Positive therapeutic relationship and Positive coping skills or problem solving skills  Continued Clinical Symptoms:  Severe Anxiety and/or Agitation Depression:   Impulsivity  Cognitive Features That Contribute To Risk:  None    Suicide Risk:  Minimal: No identifiable suicidal ideation.  Patients presenting with no risk factors but with morbid ruminations; may be classified as minimal risk based on the severity of the depressive symptoms  Follow-up Information    [facility] Follow up.   Why:  Please call and schedule a follow up therapy appointment within 24 hours of discharge. Thank You! Contact information: [address] [place], [place] [address] Phone: [phone] Fax:        Dr. Thereasa Distance MD Follow up.   Why:  They will call you to schedule your follow up medication management appointment. Thank you! Contact information: [address] [place], [place] [address] Phone: [phone] Fax:           Plan Of Care/Follow-up recommendations:  Activity:  Activity as tolerated Diet:  Regular diet Other:  Follow-up with current therapist as well as psychiatric follow-up.  Follow current medication regimen pending more medical evaluation.  Immediately get help or seek assistance if suicidal ideation recurs.  Alethia Berthold, MD 08/08/2018, 3:12 PM

## 2018-08-08 NOTE — Progress Notes (Signed)
Patient ID: Christina Hill, female   DOB: Jul 02, 1995, 23 y.o.   MRN: [id number]  Discharge Note:  Patient denies SI/HI/AVH at this time. Discharge instructions, AVS, prescriptions, and transition record gone over with patient. Patient agrees to comply with medication management, follow-up visit, and outpatient therapy. Patient belongings returned to patient. Patient questions and concerns addressed and answered. Patient ambulatory off unit. Patient discharged to home with wife.

## 2018-08-08 NOTE — Progress Notes (Signed)
D: Patient seems a little more depressed today. Says she just wants to go home. Says that she is sleepy because she did not get a nap today. Was asking for her medication early so she could go to sleep. Denies SI. Mood is mostly pleasant. Affect appropriate to circumstance. Contracting for safety.  A: Continue to monitor for safety. R: Safety maintained.

## 2018-08-08 NOTE — BHH Group Notes (Signed)
LCSW Group Therapy Note   08/08/2018 12:48 PM   Type of Therapy and Topic:  Group Therapy:  Overcoming Obstacles   Participation Level:  Active   Description of Group:    In this group patients will be encouraged to explore what they see as obstacles to their own wellness and recovery. They will be guided to discuss their thoughts, feelings, and behaviors related to these obstacles. The group will process together ways to cope with barriers, with attention given to specific choices patients can make. Each patient will be challenged to identify changes they are motivated to make in order to overcome their obstacles. This group will be process-oriented, with patients participating in exploration of their own experiences as well as giving and receiving support and challenge from other group members.   Therapeutic Goals: 1. Patient will identify personal and current obstacles as they relate to admission. 2. Patient will identify barriers that currently interfere with their wellness or overcoming obstacles.  3. Patient will identify feelings, thought process and behaviors related to these barriers. 4. Patient will identify two changes they are willing to make to overcome these obstacles:      Summary of Patient Progress Pt was appropriate and respectful in group. 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 Pt reported switching clinics to get away from the supervisor, but now he is supervising her at the new clinic. Pt was able to identify ways to deal with her obstacle and identify coping strategies.     Therapeutic Modalities:   Cognitive Behavioral Therapy Solution Focused Therapy Motivational Interviewing Relapse Prevention Therapy  Christina Hill, MSW, LCSW Clinical Social Work 08/08/2018 12:48 PM

## 2018-08-08 NOTE — Progress Notes (Signed)
  [facility] Adult Case Management Discharge Plan :  Will you be returning to the same living situation after discharge:  Yes,  home At discharge, do you have transportation home?: Yes,  wife will pick pt up Do you have the ability to pay for your medications: Yes,  [employer]  Release of information consent forms completed and in the chart;    Patient to Follow up at: Follow-up Information    [facility] Follow up.   Why:  Please call and schedule a follow up therapy appointment within 24 hours of discharge. Thank You! Contact information: [address] [place], [place] [address] Phone: [phone] Fax:        Dr. Thereasa Distance MD Follow up.   Why:  They will call you to schedule your follow up medication management appointment. Thank you! Contact information: [place], [place] [address] Phone: [phone] Fax:           Next level of care provider has access to [place] and Suicide Prevention discussed: Yes,  SPE completed with pts wife  Have you used any form of tobacco in the last 30 days? (Cigarettes, Smokeless Tobacco, Cigars, and/or Pipes): No  Has patient been referred to the Quitline?: N/A patient is not a smoker  Patient has been referred for addiction treatment: [place], LCSW 08/08/2018, 3:14 PM

## 2018-08-08 NOTE — Plan of Care (Signed)
  Problem: Education: Goal: Knowledge of [facility] Education information/materials will improve Outcome: Progressing Goal: Emotional status will improve Outcome: Progressing Goal: Mental status will improve Outcome: Progressing Goal: Verbalization of understanding the information provided will improve Outcome: Progressing   D: Patient seems a little more depressed today. Says she just wants to go home. Says that she is sleepy because she did not get a nap today. Was asking for her medication early so she could go to sleep. Denies SI. Mood is mostly pleasant. Affect appropriate to circumstance. Contracting for safety.  A: Continue to monitor for safety. R: Safety maintained.

## 2018-08-08 NOTE — Progress Notes (Signed)
Patient refused her evening medication stating that she only takes this medication once a day "unless I'm having an excerbation, that's when I take it twice".

## 2018-08-08 NOTE — Progress Notes (Signed)
D- Patient alert and oriented. Patient presents in a pleasant mood on assessment reporting that she slept good last night and had no major complaints to voice to this Probation officer. Patient stated that she feels "great, I'm ready to go home". Patient rated her depression a "2/10" and her anxiety a "4/10" on her self-inventory, however, she denied signs/symptoms to this Probation officer when asked. Patient denies SI, HI, AVH, and pain at this time. Patient's goal for today is "discharge".  A- Scheduled medications administered to patient, per MD orders. Support and encouragement provided.  Routine safety checks conducted every 15 minutes.  Patient informed to notify staff with problems or concerns.  R- No adverse drug reactions noted. Patient contracts for safety at this time. Patient compliant with medications and treatment plan. Patient receptive, calm, and cooperative. Patient interacts well with others on the unit.  Patient remains safe at this time.
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
## 2018-08-08 NOTE — Progress Notes (Addendum)
Recreation Therapy Notes   Date: 08/08/2018  Time: 9:30 am  Location: Craft room  Behavioral response: Appropriate  Intervention Topic: Stress  Discussion/Intervention:  Group content on today was focused on stress. The group defined stress and way to cope with stress. Participants expressed how they know when they are stresses out. Individuals described the different ways they have to cope with stress. The group stated reasons why it is important to cope with stress. Patient explained what good stress is and some examples. The group participated in the intervention "Exploring stress". Individuals were separated into two group and answered questions related to stress.  Clinical Observations/Feedback:  Patient came to group and identified her positive coping skills as watching television, listening to music and playing the piano. She explained that when she is stressed has anxiety and cleans up.  Individual was social with peers and staff while participating in the intervention. Caitlynn Ju LRT/CTRS         Wasyl Dornfeld 08/08/2018 12:15 PM

## 2018-08-10 NOTE — Discharge Summary (Signed)
Physician Discharge Summary Note  Patient:  Christina Hill is an 23 y.o., female MRN:  [id number] DOB:  [birth year] Patient phone:  [phone] (home)  Patient address:   [place] [address],  Total Time spent with patient: 45 minutes  Date of Admission:  08/05/2018 Date of Discharge: August 08, 2018  Reason for Admission: Admitted because of overdose on verapamil  Principal Problem: <principal problem not specified> Discharge Diagnoses: Active Problems:   Suicide attempt [facility])   Past Psychiatric History: Past history of depression  Past Medical History:  Past Medical History:  Diagnosis Date  . Allergy    seasonal allergies  . Anxiety   . Asthma    seasonal asthma  . Depression   . Herpes genitalis in women    type 1   . PTSD (post-traumatic stress disorder)     Past Surgical History:  Procedure Laterality Date  . ACNE CYST REMOVAL     back- benign  . ingrown toenail     removal   Family History:  Family History  Problem Relation Age of Onset  . Depression Mother   . Hypertension Mother   . Hyperlipidemia Mother   . Depression Father   . Depression Brother   . Hypertension Brother   . Thyroid cancer Maternal Aunt   . Breast cancer Maternal Aunt   . Depression Maternal Aunt   . Hypertension Maternal Uncle   . Hypertension Paternal Uncle   . Heart attack Paternal Grandfather   . Heart disease Paternal Grandfather   . Hypertension Paternal Grandfather    Family Psychiatric  History: See previous Social History:  Social History   Substance and Sexual Activity  Alcohol Use Yes  . Alcohol/week: 1.0 standard drinks  . Types: 1 Glasses of wine per week   Comment: 0.5 of wine weekly     Social History   Substance and Sexual Activity  Drug Use No    Social History   Socioeconomic History  . Marital status: Married    Spouse name: Not on file  . Number of children: Not on file  . Years of education: Not on file  . Highest  education level: Not on file  Occupational History  . Not on file  Social Needs  . Financial resource strain: Not on file  . Food insecurity:    Worry: Not on file    Inability: Not on file  . Transportation needs:    Medical: Not on file    Non-medical: Not on file  Tobacco Use  . Smoking status: Never Smoker  . Smokeless tobacco: Never Used  Substance and Sexual Activity  . Alcohol use: Yes    Alcohol/week: 1.0 standard drinks    Types: 1 Glasses of wine per week    Comment: 0.5 of wine weekly  . Drug use: No  . Sexual activity: Not Currently    Birth control/protection: Implant  Lifestyle  . Physical activity:    Days per week: Not on file    Minutes per session: Not on file  . Stress: Not on file  Relationships  . Social connections:    Talks on phone: Not on file    Gets together: Not on file    Attends religious service: Not on file    Active member of club or organization: Not on file    Attends meetings of clubs or organizations: Not on file    Relationship status: Not on file  Other Topics  Concern  . Not on file  Social History Narrative  . Not on file    Hospital Course: Patient was stabilized on the psychiatric unit.  Consistently denied suicidal ideation.  Showed no dangerous behavior in the hospital.  Was cooperative with treatment.  She was lucid without any delusional content or aggressive behavior.  No longer met commitment criteria.  Agree to continue outpatient treatment in the community.  Psychoeducation was completed about the dangers of overuse of medicine.  Physical Findings: AIMS:  , ,  ,  ,    CIWA:    COWS:     Musculoskeletal: Strength & Muscle Tone: within normal limits Gait & Station: normal Patient leans: N/A  Psychiatric Specialty Exam: Physical Exam  Nursing note and vitals reviewed. Constitutional: She appears well-developed and well-nourished.  HENT:  Head: Normocephalic and atraumatic.  Eyes: Pupils are equal, round, and  reactive to light. Conjunctivae are normal.  Neck: Normal range of motion.  Cardiovascular: Regular rhythm and normal heart sounds.  Respiratory: Effort normal.  GI: Soft.  Musculoskeletal: Normal range of motion.  Neurological: She is alert.  Skin: Skin is warm and dry.  Psychiatric: She has a normal mood and affect. Her behavior is normal. Judgment and thought content normal.    Review of Systems  Constitutional: Negative.   HENT: Negative.   Eyes: Negative.   Respiratory: Negative.   Cardiovascular: Negative.   Gastrointestinal: Negative.   Musculoskeletal: Negative.   Skin: Negative.   Neurological: Negative.   Psychiatric/Behavioral: Negative.     Blood pressure 123/76, pulse (!) 104, temperature 98.6 F (37 C), temperature source Oral, resp. rate 18, height '5\' 5"'  (1.651 m), weight (!) 142.9 kg, SpO2 99 %.Body mass index is 52.42 kg/m.  General Appearance: Casual  Eye Contact:  Good  Speech:  Clear and Coherent  Volume:  Normal  Mood:  Euthymic  Affect:  Congruent  Thought Process:  Goal Directed  Orientation:  Full (Time, Place, and Person)  Thought Content:  Logical  Suicidal Thoughts:  No  Homicidal Thoughts:  No  Memory:  Immediate;   Fair Recent;   Fair Remote;   Fair  Judgement:  Fair  Insight:  Fair  Psychomotor Activity:  Normal  Concentration:  Concentration: Fair  Recall:  [employer] of Knowledge:  Fair  Language:  Fair  Akathisia:  No  Handed:  Right  AIMS (if indicated):     Assets:  Desire for Improvement  ADL's:  Intact  Cognition:  WNL  Sleep:  Number of Hours: 8     Have you used any form of tobacco in the last 30 days? (Cigarettes, Smokeless Tobacco, Cigars, and/or Pipes): No  Has this patient used any form of tobacco in the last 30 days? (Cigarettes, Smokeless Tobacco, Cigars, and/or Pipes) Yes, Yes, A prescription for an FDA-approved tobacco cessation medication was offered at discharge and the patient refused  Blood Alcohol level:   Lab Results  Component Value Date   ETH <10 08/03/2018   ETH <10 40/10/6759    Metabolic Disorder Labs:  No results found for: HGBA1C, MPG No results found for: PROLACTIN No results found for: CHOL, TRIG, HDL, CHOLHDL, VLDL, LDLCALC  See Psychiatric Specialty Exam and Suicide Risk Assessment completed by Attending Physician prior to discharge.  Discharge destination:  Home  Is patient on multiple antipsychotic therapies at discharge:  No   Has Patient had three or more failed trials of antipsychotic monotherapy by history:  No  Recommended  Plan for Multiple Antipsychotic Therapies: NA  Discharge Instructions    Diet - low sodium heart healthy   Complete by:  As directed    Increase activity slowly   Complete by:  As directed      Allergies as of 08/08/2018      Reactions   Amoxicillin Anaphylaxis   Cephalosporins Anaphylaxis   Penicillins Anaphylaxis, Hives   Sodium Hypochlorite Anaphylaxis   Sulfa Antibiotics Hives, Anaphylaxis   Cucumber Extract Nausea And Vomiting   Almond (diagnostic)    Abdominal cramping      Medication List    TAKE these medications     Indication  buPROPion 150 MG 24 hr tablet Commonly known as:  Wellbutrin XL Take 1 tablet (150 mg total) by mouth every morning.  Indication:  Major Depressive Disorder   eletriptan 40 MG tablet Commonly known as:  RELPAX Take 40 mg by mouth daily as needed for headache.  Indication:  Migraine Headache   fluticasone 50 MCG/ACT nasal spray Commonly known as:  FLONASE Place 2 sprays into both nostrils daily.  Indication:  Allergic Rhinitis   ibuprofen 400 MG tablet Commonly known as:  ADVIL Take 1 tablet (400 mg total) by mouth every 6 (six) hours as needed for headache.  Indication:  Migraine Headache, Pain   LORazepam 0.5 MG tablet Commonly known as:  ATIVAN Take 0.5 mg by mouth as needed for anxiety.  Indication:  Feeling Anxious   magnesium oxide 400 MG tablet Commonly known as:   MAG-OX Take 400 mg by mouth daily.  Indication:  Disorder with Low Magnesium Levels   Melatonin Maximum Strength 5 MG Tabs Generic drug:  Melatonin Take 5 mg by mouth at bedtime.  Indication:  Trouble Sleeping   montelukast 10 MG tablet Commonly known as:  SINGULAIR Take 10 mg by mouth daily.  Indication:  Asthma   multivitamin with minerals tablet Take 1 tablet by mouth daily.  Indication:  Vitamin Deficiency   prazosin 2 MG capsule Commonly known as:  MINIPRESS Take 1 capsule (2 mg total) by mouth at bedtime.  Indication:  Frightening Dreams   prochlorperazine 10 MG tablet Commonly known as:  COMPAZINE Take 10 mg by mouth at bedtime as needed. With diphenhydramine for rescue sleep during severe migraine attack. Max three times a week.  Indication:  Nausea and Vomiting   Proventil HFA 108 (90 Base) MCG/ACT inhaler Generic drug:  albuterol Inhale into the lungs every 6 (six) hours as needed for wheezing or shortness of breath.  Indication:  Asthma   sertraline 100 MG tablet Commonly known as:  ZOLOFT Take 2 tablets (200 mg total) by mouth daily.  Indication:  Posttraumatic Stress Disorder   valACYclovir 500 MG tablet Commonly known as:  VALTREX Take 500 mg by mouth 2 (two) times daily.  Indication:  Herpes Simplex Infection   verapamil 240 MG 24 hr capsule Commonly known as:  VERELAN PM Take 240 mg by mouth at bedtime. What changed:  Another medication with the same name was added. Make sure you understand how and when to take each.  Indication:  High Blood Pressure of Unknown Cause   verapamil 240 MG CR tablet Commonly known as:  CALAN-SR Take 1 tablet (240 mg total) by mouth daily. What changed:  You were already taking a medication with the same name, and this prescription was added. Make sure you understand how and when to take each.  Indication:  High Blood Pressure of Unknown Cause   vitamin  B-12 1000 MCG tablet Commonly known as:  CYANOCOBALAMIN Take  1,000 mcg by mouth daily.  Indication:  Inadequate Vitamin B12      Follow-up Information    [facility] Follow up.   Why:  Please call and schedule a follow up therapy appointment within 24 hours of discharge. Thank You! Contact information: [address] [place], [place] [address] Phone: [phone] Fax:        Dr. Thereasa Distance MD Follow up.   Why:  They will call you to schedule your follow up medication management appointment. Thank you! Contact information: [place], [place] [address] Phone: [phone] Fax:           Follow-up recommendations:  Activity:  Activity as tolerated Diet:  Regular diet Other:  Follow-up with outpatient psychiatrist and therapist  Comments: Patient was calm and lucid denied any suicidal ideation showed improved insight.  No longer met commitment criteria.  Has good outpatient treatment in place.  Signed: Alethia Berthold, MD 08/10/2018, 5:41 PM

## 2018-10-06 ENCOUNTER — Other Ambulatory Visit: Payer: Self-pay | Admitting: Psychiatry
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
## 2018-12-09 ENCOUNTER — Encounter
Admission: RE | Admit: 2018-12-09 | Discharge: 2018-12-09 | Disposition: A | Discharge status: Home / Self Care | Payer: 59 | Source: Ambulatory Visit | Attending: Obstetrics & Gynecology | Admitting: Obstetrics & Gynecology

## 2018-12-09 ENCOUNTER — Other Ambulatory Visit: Payer: Self-pay

## 2018-12-09 DIAGNOSIS — N920 Excessive and frequent menstruation with regular cycle: Secondary | ICD-10-CM | POA: Insufficient documentation

## 2018-12-09 DIAGNOSIS — Z01812 Encounter for preprocedural laboratory examination: Secondary | ICD-10-CM | POA: Insufficient documentation

## 2018-12-09 HISTORY — DX: Headache, unspecified: R51.9

## 2018-12-09 HISTORY — DX: Gastro-esophageal reflux disease without esophagitis: K21.9

## 2018-12-09 HISTORY — DX: Pneumonia, unspecified organism: J18.9

## 2018-12-09 NOTE — Patient Instructions (Signed)
Your procedure is scheduled on: Friday 10/16 Report to Day Surgery. To find out your arrival time please call [phone] between 1PM - 3PM on Thurs 10/15.  Remember: Instructions that are not followed completely may result in serious medical risk,  up to and including death, or upon the discretion of your surgeon and anesthesiologist your  surgery may need to be rescheduled.     _X__ 1. Do not eat food after midnight the night before your procedure.                 No gum chewing or hard candies. You may drink clear liquids up to 2 hours                 before you are scheduled to arrive for your surgery- DO not drink clear                 liquids within 2 hours of the start of your surgery.                 Clear Liquids include:  water, apple juice without pulp, clear carbohydrate                 drink (Ensure) complete this dring 2 hours before arrival                 Gatorade, Black Coffee or Tea (Do not add                 anything to coffee or tea).  __X__2.  On the morning of surgery brush your teeth with toothpaste and water, you                may rinse your mouth with mouthwash if you wish.  Do not swallow any toothpaste of mouthwash.     _X__ 3.  No Alcohol for 24 hours before or after surgery.   _X__ 4.  Do Not Smoke or use e-cigarettes For 24 Hours Prior to Your Surgery.                 Do not use any chewable tobacco products for at least 6 hours prior to                 surgery.  ____  5.  Bring all medications with you on the day of surgery if instructed.   __x__  6.  Notify your doctor if there is any change in your medical condition      (cold, fever, infections).     Do not wear jewelry, make-up, hairpins, clips or nail polish. Do not wear lotions, powders, or perfumes. You may wear deodorant. Do not shave 48 hours prior to surgery. Men may shave face and neck. Do not bring valuables to the hospital.    [facility] is not responsible  for any belongings or valuables.  Contacts, dentures or bridgework may not be worn into surgery. Leave your suitcase in the car. After surgery it may be brought to your room. For patients admitted to the hospital, discharge time is determined by your treatment team.   Patients discharged the day of surgery will not be allowed to drive home.   Please read over the following fact sheets that you were given:    __ Take these medicines the morning of surgery with A SIP OF WATER:    1.   2.   3.   4.  5.  6.  ____  Fleet Enema (as directed)   __x__ Use CHG Soap as directed  _x___ Use inhalers on the day of surgery albuterol (PROVENTIL HFA) 108 (90 Base) MCG/ACT inhaler  And bring with you.  ____ Stop metformin 2 days prior to surgery    ____ Take 1/2 of usual insulin dose the night before surgery. No insulin the morning          of surgery.   ____ Stop Coumadin/Plavix/aspirin   _x___ Stop Anti-inflammatories Ibuprofen Aleve or Aspirin today   ____ Stop supplements until after surgery.    ____ Bring C-Pap to the hospital.

## 2018-12-13 ENCOUNTER — Other Ambulatory Visit: Payer: Self-pay

## 2018-12-13 ENCOUNTER — Other Ambulatory Visit
Admission: RE | Admit: 2018-12-13 | Discharge: 2018-12-13 | Disposition: A | Discharge status: Home / Self Care | Payer: 59 | Source: Ambulatory Visit | Attending: Obstetrics & Gynecology | Admitting: Obstetrics & Gynecology

## 2018-12-13 DIAGNOSIS — Z01812 Encounter for preprocedural laboratory examination: Secondary | ICD-10-CM | POA: Insufficient documentation

## 2018-12-13 DIAGNOSIS — N920 Excessive and frequent menstruation with regular cycle: Secondary | ICD-10-CM | POA: Diagnosis not present

## 2018-12-13 DIAGNOSIS — Z20828 Contact with and (suspected) exposure to other viral communicable diseases: Secondary | ICD-10-CM | POA: Insufficient documentation

## 2018-12-13 LAB — SARS CORONAVIRUS 2 (TAT 6-24 HRS): SARS Coronavirus 2: NEGATIVE

## 2018-12-13 LAB — BASIC METABOLIC PANEL
Anion gap: 9 (ref 5–15)
BUN: 13 mg/dL (ref 6–20)
CO2: 25 mmol/L (ref 22–32)
Calcium: 9.2 mg/dL (ref 8.9–10.3)
Chloride: 104 mmol/L (ref 98–111)
Creatinine, Ser: 0.71 mg/dL (ref 0.44–1.00)
GFR calc Af Amer: >60 mL/min (ref >60)
GFR calc non Af Amer: >60 mL/min (ref >60)
Glucose, Bld: 86 mg/dL (ref 70–99)
Potassium: 4 mmol/L (ref 3.5–5.1)
Sodium: 138 mmol/L (ref 135–145)

## 2018-12-13 LAB — TYPE AND SCREEN
ABO/RH(D): AB NEG
Antibody Screen: NEGATIVE

## 2018-12-13 LAB — CBC
HCT: 37.6 % (ref 36.0–46.0)
Hemoglobin: 11.9 g/dL — ABNORMAL LOW (ref 12.0–15.0)
MCH: 24 pg — ABNORMAL LOW (ref 26.0–34.0)
MCHC: 31.6 g/dL (ref 30.0–36.0)
MCV: 75.8 fL — ABNORMAL LOW (ref 80.0–100.0)
Platelets: 300 10*3/uL (ref 150–400)
RBC: 4.96 MIL/uL (ref 3.87–5.11)
RDW: 13.7 % (ref 11.5–15.5)
WBC: 12 10*3/uL — ABNORMAL HIGH (ref 4.0–10.5)
nRBC: 0 % (ref 0.0–0.2)

## 2018-12-15 MED ORDER — CLINDAMYCIN PHOSPHATE 900 MG/50ML IV SOLN
900.0000 mg | INTRAVENOUS | Status: AC
  Administered 2018-12-16: 900 mg via INTRAVENOUS

## 2018-12-15 MED ORDER — GENTAMICIN SULFATE 40 MG/ML IJ SOLN
480.0000 mg | INTRAVENOUS | Status: AC
  Administered 2018-12-16: 480 mg via INTRAVENOUS
  Filled 2018-12-15: qty 12

## 2018-12-16 ENCOUNTER — Ambulatory Visit: Payer: 59 | Admitting: Certified Registered Nurse Anesthetist

## 2018-12-16 ENCOUNTER — Ambulatory Visit
Admission: RE | Admit: 2018-12-16 | Discharge: 2018-12-16 | Disposition: A | Discharge status: Home / Self Care | Payer: 59 | Attending: Obstetrics & Gynecology | Admitting: Obstetrics & Gynecology

## 2018-12-16 ENCOUNTER — Encounter
Admission: RE | Disposition: A | Discharge status: Home / Self Care | Payer: Self-pay | Source: Home / Self Care | Attending: Obstetrics & Gynecology

## 2018-12-16 ENCOUNTER — Encounter: Payer: Self-pay | Admitting: Certified Registered Nurse Anesthetist

## 2018-12-16 ENCOUNTER — Other Ambulatory Visit: Payer: Self-pay

## 2018-12-16 DIAGNOSIS — Z882 Allergy status to sulfonamides status: Secondary | ICD-10-CM | POA: Diagnosis not present

## 2018-12-16 DIAGNOSIS — F332 Major depressive disorder, recurrent severe without psychotic features: Secondary | ICD-10-CM | POA: Insufficient documentation

## 2018-12-16 DIAGNOSIS — Z880 Allergy status to penicillin: Secondary | ICD-10-CM | POA: Insufficient documentation

## 2018-12-16 DIAGNOSIS — Z79899 Other long term (current) drug therapy: Secondary | ICD-10-CM | POA: Insufficient documentation

## 2018-12-16 DIAGNOSIS — F4310 Post-traumatic stress disorder, unspecified: Secondary | ICD-10-CM | POA: Insufficient documentation

## 2018-12-16 DIAGNOSIS — Z881 Allergy status to other antibiotic agents status: Secondary | ICD-10-CM | POA: Insufficient documentation

## 2018-12-16 DIAGNOSIS — N920 Excessive and frequent menstruation with regular cycle: Secondary | ICD-10-CM | POA: Diagnosis present

## 2018-12-16 DIAGNOSIS — J4520 Mild intermittent asthma, uncomplicated: Secondary | ICD-10-CM | POA: Diagnosis not present

## 2018-12-16 HISTORY — PX: REMOVAL OF DRUG DELIVERY IMPLANT: SHX6585

## 2018-12-16 HISTORY — PX: LAPAROSCOPIC SUPRACERVICAL HYSTERECTOMY: SHX5399

## 2018-12-16 HISTORY — PX: LAPAROSCOPIC BILATERAL SALPINGECTOMY: SHX5889

## 2018-12-16 LAB — ABO/RH: ABO/RH(D): AB NEG

## 2018-12-16 LAB — POCT PREGNANCY, URINE: Preg Test, Ur: NEGATIVE

## 2018-12-16 SURGERY — HYSTERECTOMY, SUPRACERVICAL, LAPAROSCOPIC
Anesthesia: General | Site: Arm Upper

## 2018-12-16 MED ORDER — FENTANYL CITRATE (PF) 100 MCG/2ML IJ SOLN
INTRAMUSCULAR | Status: AC
  Filled 2018-12-16: qty 2

## 2018-12-16 MED ORDER — OXYCODONE HCL 5 MG PO TABS: 5.0000 mg | ORAL_TABLET | Freq: Once | ORAL | Status: DC | PRN

## 2018-12-16 MED ORDER — ONDANSETRON HCL 4 MG/2ML IJ SOLN
INTRAMUSCULAR | Status: DC | PRN
  Administered 2018-12-16: 4 mg via INTRAVENOUS

## 2018-12-16 MED ORDER — CLINDAMYCIN PHOSPHATE 900 MG/50ML IV SOLN
INTRAVENOUS | Status: AC
  Filled 2018-12-16: qty 50

## 2018-12-16 MED ORDER — MIDAZOLAM HCL 2 MG/2ML IJ SOLN
INTRAMUSCULAR | Status: AC
  Filled 2018-12-16: qty 2

## 2018-12-16 MED ORDER — GABAPENTIN 300 MG PO CAPS
ORAL_CAPSULE | ORAL | Status: AC
  Administered 2018-12-16: 600 mg via ORAL
  Filled 2018-12-16: qty 2

## 2018-12-16 MED ORDER — SUCCINYLCHOLINE CHLORIDE 20 MG/ML IJ SOLN
INTRAMUSCULAR | Status: AC
  Filled 2018-12-16: qty 1

## 2018-12-16 MED ORDER — GABAPENTIN 300 MG PO CAPS
600.0000 mg | ORAL_CAPSULE | ORAL | Status: AC
  Administered 2018-12-16: 09:00:00 600 mg via ORAL

## 2018-12-16 MED ORDER — ROCURONIUM BROMIDE 50 MG/5ML IV SOLN
INTRAVENOUS | Status: AC
  Filled 2018-12-16: qty 1

## 2018-12-16 MED ORDER — SUCCINYLCHOLINE CHLORIDE 20 MG/ML IJ SOLN
INTRAMUSCULAR | Status: DC | PRN
  Administered 2018-12-16: 150 mg via INTRAVENOUS

## 2018-12-16 MED ORDER — SCOPOLAMINE 1 MG/3DAYS TD PT72
MEDICATED_PATCH | TRANSDERMAL | Status: AC
  Administered 2018-12-16: 1.5 mg via TRANSDERMAL
  Filled 2018-12-16: qty 1

## 2018-12-16 MED ORDER — OXYCODONE HCL 5 MG PO TABS: 5.0000 mg | ORAL_TABLET | Freq: Three times a day (TID) | ORAL | 0 refills | Status: DC | PRN

## 2018-12-16 MED ORDER — OXYCODONE HCL 5 MG PO TABS: 5.0000 mg | ORAL_TABLET | ORAL | 0 refills | Status: DC | PRN

## 2018-12-16 MED ORDER — ENSURE PRE-SURGERY PO LIQD
296.0000 mL | Freq: Once | ORAL | Status: DC
  Filled 2018-12-16: qty 296

## 2018-12-16 MED ORDER — KETOROLAC TROMETHAMINE 30 MG/ML IJ SOLN
INTRAMUSCULAR | Status: DC | PRN
  Administered 2018-12-16: 30 mg via INTRAVENOUS

## 2018-12-16 MED ORDER — SUGAMMADEX SODIUM 200 MG/2ML IV SOLN
INTRAVENOUS | Status: DC | PRN
  Administered 2018-12-16: 300 mg via INTRAVENOUS

## 2018-12-16 MED ORDER — ONDANSETRON HCL 4 MG/2ML IJ SOLN
INTRAMUSCULAR | Status: AC
  Filled 2018-12-16: qty 2

## 2018-12-16 MED ORDER — SUGAMMADEX SODIUM 200 MG/2ML IV SOLN
INTRAVENOUS | Status: AC
  Filled 2018-12-16: qty 2

## 2018-12-16 MED ORDER — DEXAMETHASONE SODIUM PHOSPHATE 10 MG/ML IJ SOLN
INTRAMUSCULAR | Status: DC | PRN
  Administered 2018-12-16: 10 mg via INTRAVENOUS

## 2018-12-16 MED ORDER — DEXAMETHASONE SODIUM PHOSPHATE 10 MG/ML IJ SOLN
INTRAMUSCULAR | Status: AC
  Filled 2018-12-16: qty 1

## 2018-12-16 MED ORDER — ACETAMINOPHEN 500 MG PO TABS
1000.0000 mg | ORAL_TABLET | ORAL | Status: AC
  Administered 2018-12-16: 09:00:00 1000 mg via ORAL

## 2018-12-16 MED ORDER — SCOPOLAMINE 1 MG/3DAYS TD PT72
1.0000 | MEDICATED_PATCH | TRANSDERMAL | Status: DC
  Administered 2018-12-16: 09:00:00 1.5 mg via TRANSDERMAL

## 2018-12-16 MED ORDER — SILVER NITRATE-POT NITRATE 75-25 % EX MISC
CUTANEOUS | Status: DC | PRN
  Administered 2018-12-16: 1

## 2018-12-16 MED ORDER — LIDOCAINE HCL 4 % MT SOLN
OROMUCOSAL | Status: DC | PRN
  Administered 2018-12-16: 4 mL via TOPICAL

## 2018-12-16 MED ORDER — FENTANYL CITRATE (PF) 100 MCG/2ML IJ SOLN: 25.0000 ug | INTRAMUSCULAR | Status: DC | PRN

## 2018-12-16 MED ORDER — FAMOTIDINE 20 MG PO TABS
20.0000 mg | ORAL_TABLET | Freq: Once | ORAL | Status: AC
  Administered 2018-12-16: 09:00:00 20 mg via ORAL

## 2018-12-16 MED ORDER — MIDAZOLAM HCL 2 MG/2ML IJ SOLN
INTRAMUSCULAR | Status: DC | PRN
  Administered 2018-12-16: 2 mg via INTRAVENOUS

## 2018-12-16 MED ORDER — MEPERIDINE HCL 50 MG/ML IJ SOLN: 6.2500 mg | INTRAMUSCULAR | Status: DC | PRN

## 2018-12-16 MED ORDER — KETOROLAC TROMETHAMINE 15 MG/ML IJ SOLN
15.0000 mg | INTRAMUSCULAR | Status: DC
  Filled 2018-12-16: qty 1

## 2018-12-16 MED ORDER — FENTANYL CITRATE (PF) 250 MCG/5ML IJ SOLN
INTRAMUSCULAR | Status: DC | PRN
  Administered 2018-12-16 (×2): 50 ug via INTRAVENOUS
  Administered 2018-12-16: 100 ug via INTRAVENOUS
  Administered 2018-12-16 (×3): 50 ug via INTRAVENOUS
  Administered 2018-12-16: 100 ug via INTRAVENOUS

## 2018-12-16 MED ORDER — HEPARIN SODIUM (PORCINE) 5000 UNIT/ML IJ SOLN
5000.0000 [IU] | INTRAMUSCULAR | Status: AC
  Administered 2018-12-16: 09:00:00 5000 [IU] via SUBCUTANEOUS

## 2018-12-16 MED ORDER — PROPOFOL 10 MG/ML IV BOLUS
INTRAVENOUS | Status: DC | PRN
  Administered 2018-12-16: 180 mg via INTRAVENOUS

## 2018-12-16 MED ORDER — LIDOCAINE HCL (CARDIAC) PF 100 MG/5ML IV SOSY
PREFILLED_SYRINGE | INTRAVENOUS | Status: DC | PRN
  Administered 2018-12-16: 100 mg via INTRATRACHEAL

## 2018-12-16 MED ORDER — HEMOSTATIC AGENTS (NO CHARGE) OPTIME
TOPICAL | Status: DC | PRN
  Administered 2018-12-16: 1 via TOPICAL

## 2018-12-16 MED ORDER — OXYCODONE HCL 5 MG/5ML PO SOLN: 5.0000 mg | Freq: Once | ORAL | Status: DC | PRN

## 2018-12-16 MED ORDER — ACETAMINOPHEN 500 MG PO TABS
ORAL_TABLET | ORAL | Status: AC
  Administered 2018-12-16: 1000 mg via ORAL
  Filled 2018-12-16: qty 2

## 2018-12-16 MED ORDER — ROCURONIUM BROMIDE 100 MG/10ML IV SOLN
INTRAVENOUS | Status: DC | PRN
  Administered 2018-12-16: 45 mg via INTRAVENOUS
  Administered 2018-12-16: 20 mg via INTRAVENOUS
  Administered 2018-12-16: 10 mg via INTRAVENOUS
  Administered 2018-12-16: 20 mg via INTRAVENOUS
  Administered 2018-12-16: 10 mg via INTRAVENOUS
  Administered 2018-12-16: 5 mg via INTRAVENOUS

## 2018-12-16 MED ORDER — LACTATED RINGERS IV SOLN
INTRAVENOUS | Status: DC
  Administered 2018-12-16 (×2): via INTRAVENOUS

## 2018-12-16 MED ORDER — DEXAMETHASONE SODIUM PHOSPHATE 10 MG/ML IJ SOLN
4.0000 mg | INTRAMUSCULAR | Status: AC
  Administered 2018-12-16: 09:00:00 4 mg via INTRAVENOUS

## 2018-12-16 MED ORDER — IBUPROFEN 800 MG PO TABS: 800.0000 mg | ORAL_TABLET | Freq: Four times a day (QID) | ORAL | 1 refills | Status: DC

## 2018-12-16 MED ORDER — LIDOCAINE HCL (PF) 2 % IJ SOLN
INTRAMUSCULAR | Status: AC
  Filled 2018-12-16: qty 10

## 2018-12-16 MED ORDER — PHENYLEPHRINE HCL (PRESSORS) 10 MG/ML IV SOLN
INTRAVENOUS | Status: DC | PRN
  Administered 2018-12-16 (×2): 100 ug via INTRAVENOUS
  Administered 2018-12-16: 200 ug via INTRAVENOUS
  Administered 2018-12-16 (×2): 100 ug via INTRAVENOUS

## 2018-12-16 MED ORDER — DEXAMETHASONE SODIUM PHOSPHATE 10 MG/ML IJ SOLN
INTRAMUSCULAR | Status: AC
  Administered 2018-12-16: 4 mg via INTRAVENOUS
  Filled 2018-12-16: qty 1

## 2018-12-16 MED ORDER — FENTANYL CITRATE (PF) 250 MCG/5ML IJ SOLN
INTRAMUSCULAR | Status: AC
  Filled 2018-12-16: qty 5

## 2018-12-16 MED ORDER — FAMOTIDINE 20 MG PO TABS
ORAL_TABLET | ORAL | Status: AC
  Administered 2018-12-16: 20 mg via ORAL
  Filled 2018-12-16: qty 1

## 2018-12-16 MED ORDER — HEPARIN SODIUM (PORCINE) 5000 UNIT/ML IJ SOLN
INTRAMUSCULAR | Status: AC
  Administered 2018-12-16: 5000 [IU] via SUBCUTANEOUS
  Filled 2018-12-16: qty 1

## 2018-12-16 MED ORDER — PROMETHAZINE HCL 25 MG/ML IJ SOLN: 6.2500 mg | INTRAMUSCULAR | Status: DC | PRN

## 2018-12-16 MED ORDER — PROPOFOL 10 MG/ML IV BOLUS
INTRAVENOUS | Status: AC
  Filled 2018-12-16: qty 20

## 2018-12-16 MED ORDER — KETOROLAC TROMETHAMINE 60 MG/2ML IM SOLN
INTRAMUSCULAR | Status: AC
  Filled 2018-12-16: qty 2

## 2018-12-16 SURGICAL SUPPLY — 61 items
ANCHOR TIS RET SYS 1550ML (BAG) ×4 IMPLANT
BAG URINE DRAINAGE (UROLOGICAL SUPPLIES) ×4 IMPLANT
BASIN GRAD PLASTIC 32OZ STRL (MISCELLANEOUS) ×4 IMPLANT
BLADE SURG SZ11 CARB STEEL (BLADE) ×8 IMPLANT
BNDG COHESIVE 4X5 TAN STRL (GAUZE/BANDAGES/DRESSINGS) ×4 IMPLANT
CANISTER SUCT 1200ML W/VALVE (MISCELLANEOUS) ×4 IMPLANT
CATH FOLEY 2WAY  5CC 16FR (CATHETERS) ×1
CATH URTH 16FR FL 2W BLN LF (CATHETERS) ×3 IMPLANT
CHLORAPREP W/TINT 26 (MISCELLANEOUS) ×4 IMPLANT
COVER WAND RF STERILE (DRAPES) ×4 IMPLANT
DEFOGGER SCOPE WARMER CLEARIFY (MISCELLANEOUS) ×4 IMPLANT
DERMABOND ADVANCED (GAUZE/BANDAGES/DRESSINGS) ×1
DERMABOND ADVANCED .7 DNX12 (GAUZE/BANDAGES/DRESSINGS) ×3 IMPLANT
DRAPE 3/4 80X56 (DRAPES) ×4 IMPLANT
DRAPE LEGGINS SURG 28X43 STRL (DRAPES) ×4 IMPLANT
DRAPE UNDER BUTTOCK W/FLU (DRAPES) ×4 IMPLANT
GAUZE 4X4 16PLY RFD (DISPOSABLE) ×4 IMPLANT
GLOVE BIO SURGEON STRL SZ7 (GLOVE) ×20 IMPLANT
GLOVE INDICATOR 7.5 STRL GRN (GLOVE) ×4 IMPLANT
GLOVE PI ORTHOPRO 6.5 (GLOVE) ×5
GLOVE PI ORTHOPRO STRL 6.5 (GLOVE) ×15 IMPLANT
GLOVE SURG SYN 6.5 ES PF (GLOVE) ×8 IMPLANT
GOWN STRL REUS W/ TWL LRG LVL3 (GOWN DISPOSABLE) ×9 IMPLANT
GOWN STRL REUS W/ TWL XL LVL3 (GOWN DISPOSABLE) ×3 IMPLANT
GOWN STRL REUS W/TWL LRG LVL3 (GOWN DISPOSABLE) ×3
GOWN STRL REUS W/TWL XL LVL3 (GOWN DISPOSABLE) ×1
GRASPER SUT TROCAR 14GX15 (MISCELLANEOUS) ×4 IMPLANT
HEMOSTAT ARISTA ABSORB 1G (HEMOSTASIS) ×4 IMPLANT
IRRIGATION STRYKERFLOW (MISCELLANEOUS) IMPLANT
IRRIGATOR STRYKERFLOW (MISCELLANEOUS)
IV LACTATED RINGERS 1000ML (IV SOLUTION) ×4 IMPLANT
KIT PINK PAD W/HEAD ARE REST (MISCELLANEOUS) ×4
KIT PINK PAD W/HEAD ARM REST (MISCELLANEOUS) ×3 IMPLANT
KIT TURNOVER CYSTO (KITS) ×4 IMPLANT
L-HOOK LAP DISP 36CM (ELECTROSURGICAL) ×4
LABEL OR SOLS (LABEL) ×4 IMPLANT
LHOOK LAP DISP 36CM (ELECTROSURGICAL) ×3 IMPLANT
LIGASURE VESSEL 5MM BLUNT TIP (ELECTROSURGICAL) ×4 IMPLANT
MANIPULATOR UTERINE 4.5 ZUMI (MISCELLANEOUS) IMPLANT
MORCELLATOR XCISE  COR (MISCELLANEOUS)
MORCELLATOR XCISE COR (MISCELLANEOUS) IMPLANT
NS IRRIG 500ML POUR BTL (IV SOLUTION) ×4 IMPLANT
PACK LAP CHOLECYSTECTOMY (MISCELLANEOUS) ×4 IMPLANT
PAD OB MATERNITY 4.3X12.25 (PERSONAL CARE ITEMS) ×4 IMPLANT
PAD PREP 24X41 OB/GYN DISP (PERSONAL CARE ITEMS) ×4 IMPLANT
PENCIL ELECTRO HAND CTR (MISCELLANEOUS) ×4 IMPLANT
POUCH SPECIMEN RETRIEVAL 10MM (ENDOMECHANICALS) IMPLANT
RETRACTOR WOUND ALXS 18CM SML (MISCELLANEOUS) ×3 IMPLANT
RTRCTR WOUND ALEXIS O 18CM SML (MISCELLANEOUS) ×4
SET TUBE SMOKE EVAC HIGH FLOW (TUBING) ×4 IMPLANT
SLEEVE ENDOPATH XCEL 5M (ENDOMECHANICALS) ×8 IMPLANT
SPONGE LAP 18X18 RF (DISPOSABLE) ×4 IMPLANT
SUT MNCRL 4-0 (SUTURE) ×1
SUT MNCRL 4-0 27XMFL (SUTURE) ×3
SUT MNCRL AB 4-0 PS2 18 (SUTURE) ×4 IMPLANT
SUT VIC AB 0 CT1 36 (SUTURE) ×4 IMPLANT
SUT VICRYL 0 AB UR-6 (SUTURE) ×8 IMPLANT
SUTURE MNCRL 4-0 27XMF (SUTURE) ×3 IMPLANT
TROCAR ENDO BLADELESS 11MM (ENDOMECHANICALS) IMPLANT
TROCAR XCEL NON-BLD 5MMX100MML (ENDOMECHANICALS) ×4 IMPLANT
TUBING EVAC SMOKE HEATED PNEUM (TUBING) IMPLANT
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
## 2018-12-16 NOTE — Anesthesia Procedure Notes (Signed)
Procedure Name: Intubation Date/Time: 12/16/2018 11:41 AM Performed by: Eben Burow, CRNA Pre-anesthesia Checklist: Patient identified, Emergency Drugs available and Suction available Patient Re-evaluated:Patient Re-evaluated prior to induction Oxygen Delivery Method: Circle system utilized Preoxygenation: Pre-oxygenation with 100% oxygen Induction Type: IV induction Ventilation: Two handed mask ventilation required Laryngoscope Size: McGraph and 3 Grade View: Grade I Tube type: Oral Tube size: 7.5 mm Number of attempts: 1 Airway Equipment and Method: Stylet,  Oral airway,  Video-laryngoscopy and LTA kit utilized Placement Confirmation: ETT inserted through vocal cords under direct vision,  positive ETCO2 and breath sounds checked- equal and bilateral Secured at: 22 cm Tube secured with: Tape Dental Injury: Teeth and Oropharynx as per pre-operative assessment

## 2018-12-16 NOTE — Anesthesia Post-op Follow-up Note (Signed)
Anesthesia QCDR form completed.        

## 2018-12-16 NOTE — Discharge Instructions (Addendum)
Discharge instructions:  Call office if you have any of the following: fever >101 F, chills, shortness of breath, excessive vaginal bleeding, incision drainage or problems, leg pain or redness, or any other concerns.   Activity: Do not lift > 10 lbs for 8 weeks.   No driving for 1-2 weeks, or until you are certain you can slam on the brakes.   You may feel some pain in your upper right abdomen/rib and right shoulder.  This is from the gas in the abdomen for surgery. This will subside over time, please be patient!  Take 600mg  Ibuprofen and 1000mg  Tylenol (together) around the clock, every 6 hours for at least the first 3-5 days.  After this you can take as needed.  This will help decrease inflammation and promote healing.  The narcotics you'll take just as needed, as they just trick your brain into thinking its not in pain.    Please don't limit yourself in terms of routine activity.  You will be able to do most things, although they may take longer to do or be a little painful.  You can do it!  Don't be a hero, but don't be a wimp either!     AMBULATORY SURGERY  DISCHARGE INSTRUCTIONS   1) The drugs that you were given will stay in your system until tomorrow so for the next 24 hours you should not:  A) Drive an automobile B) Make any legal decisions C) Drink any alcoholic beverage   2) You may resume regular meals tomorrow.  Today it is better to start with liquids and gradually work up to solid foods.  You may eat anything you prefer, but it is better to start with liquids, then soup and crackers, and gradually work up to solid foods.   3) Please notify your doctor immediately if you have any unusual bleeding, trouble breathing, redness and pain at the surgery site, drainage, fever, or pain not relieved by medication.    4) Additional Instructions:        Please contact your physician with any problems or Same Day Surgery at [phone], Monday through Friday 6 am to 4  pm, or [place] at [facility] number at [phone].

## 2018-12-16 NOTE — Transfer of Care (Signed)
Immediate Anesthesia Transfer of Care Note  Patient: Christina Hill  Procedure(s) Performed: LAPAROSCOPIC SUPRACERVICAL HYSTERECTOMY (N/A ) LAPAROSCOPIC BILATERAL SALPINGECTOMY (Bilateral ) REMOVAL OF DRUG DELIVERY IMPLANT (Left Arm Upper)  Patient Location: PACU  Anesthesia Type:General  Level of Consciousness: awake, alert , oriented and patient cooperative  Airway & Oxygen Therapy: Patient Spontanous Breathing and Patient connected to face mask oxygen  Post-op Assessment: Report given to RN and Post -op Vital signs reviewed and stable  Post vital signs: Reviewed and stable  Last Vitals:  Vitals Value Taken Time  BP 137/78 12/16/18 1444  Temp    Pulse 123 12/16/18 1445  Resp 20 12/16/18 1445  SpO2 98 % 12/16/18 1445  Vitals shown include unvalidated device data.  Last Pain:  Vitals:   12/16/18 0841  PainSc: 0-No pain         Complications: No apparent anesthesia complications

## 2018-12-16 NOTE — Op Note (Addendum)
Laparoscopic Supracervical Hysterectomy Operative Note Procedure Date: 12/16/2018  Patient:  Christina Hill  23 y.o. female  PRE-OPERATIVE DIAGNOSIS:  Menorrhagia  POST-OPERATIVE DIAGNOSIS:  menorrhagia  PROCEDURE:  Procedure(s): LAPAROSCOPIC SUPRACERVICAL HYSTERECTOMY (N/A) LAPAROSCOPIC BILATERAL SALPINGECTOMY (Bilateral) REMOVAL OF DRUG DELIVERY IMPLANT (Left)  SURGEON:  Surgeon(s) and Role:    * Rossetta Kama, Honor Loh, MD - Primary    * Benjaman Kindler, MD - Assisting  ANESTHESIA:  General via ET  I/O  Total I/O In: 1400 [I.V.:1400] Out: 120 [Urine:100; Blood:20]  FINDINGS:  Small uterus, normal ovaries and fallopian tubes bilaterally.  Normal upper abdomen. Nexplanon in LEFT arm  SPECIMEN: Uterus and bilateral fallopian tubes  COMPLICATIONS: none apparent  DISPOSITION: vital signs stable to PACU  Indication for Surgery: 23 y.o. G0 who desired removal of uterus due to excessive bleeding unresponsive to hormonal manipulation as well as a staunch desire to never have children.    Risks of surgery were discussed with the patient including but not limited to: bleeding which may require transfusion or reoperation; infection which may require antibiotics; injury to bowel, bladder, ureters or other surrounding organs; need for additional procedures including laparotomy, blood clot, incisional problems and other postoperative/anesthesia complications. Written informed consent was obtained.      PROCEDURE IN DETAIL:  The patient had 5000u Heparin Sub-q and sequential compression devices applied to her lower extremities while in the preoperative area.  She was then taken to the operating room. IV antibiotics were given. General anesthesia was administered via endotracheal route.  She was placed in the dorsal lithotomy position, and was prepped and draped in a sterile manner. A surgical time-out was performed.  A Foley catheter was inserted into her bladder and attached to constant  drainage and a uterine manipulator was then advanced into the uterus. The gloves were changed, and attention was turned to the abdomen where an umbilical incision was made with the scalpel.  A 81mm trochar was inserted in the umbilical incision using a visiport method.Opening pressure was 51mmHg, and the abdomen was insufflated to 28mmHg carbon dioxide gas and adequate pneumoperitoneum was obtained. A survey of the patient's pelvis and abdomen revealed the findings as mentioned above. Two 33mm ports were inserted in the lower left and right quadrants under visualization.    The bilateral fallopian tubes were separated from the mesosalpinx using the Ligasure. The bilateral round ligaments were transected and anterior broad ligament divided and brought across the uterus to separate the vesicouterine peritoneum and create a bladder flap. The bladder was pushed away from the uterus. The bilateral uterine arteries were skeletonized, ligated and transected. At the junction of the internal cervical os, the uterus was transected using the bovie hook.The umbilical incision was converted into a 64mm incision and an endocatch bag was inserted.  The uterus and tubes were placed inside the bag, and brought through the umbilical incision.  The uterus was morcellated into two pieces to facilitate removal. The fascia of this incision was grasped with Alis clamps and closed with 0 vicryl in a running stitch.  The pneumoperitoneum was recreated and surgical site inspected, and found to be hemostatic. No intraoperative injury to surrounding organs was noted. The abdomen was desufflated and all instruments were then removed.   All skin incisions were closed with 4-0 monocryl and covered with surgical glue. The foley and uterine manipulator were removed and the tenaculum site on the cervix was hemostatic after application of silver nitrate.   After a change of gloves, the left  arm at the site of the nexplanon was prepped  with chlorhexidine. A skin incision was made with an 11 blade, and the capsule around the device was punctured.  The device was guided into the incision and removed without difficulty.  This skin site was also closed with surgical glue and covered in a bandage to tamponade the site.      The patient tolerated the procedures well.  All instruments, needles, and sponge counts were correct x 2. The patient was taken to the recovery room in stable condition.   ---- Ranae Plumber, MD Attending Obstetrician and Gynecologist [facility]

## 2018-12-19 ENCOUNTER — Encounter: Payer: Self-pay | Admitting: Obstetrics & Gynecology

## 2018-12-20 LAB — SURGICAL PATHOLOGY

## 2018-12-26 NOTE — Anesthesia Postprocedure Evaluation (Signed)
Anesthesia Post Note  Patient: Christina Hill  Procedure(s) Performed: LAPAROSCOPIC SUPRACERVICAL HYSTERECTOMY (N/A ) LAPAROSCOPIC BILATERAL SALPINGECTOMY (Bilateral ) REMOVAL OF DRUG DELIVERY IMPLANT (Left Arm Upper)  Patient location during evaluation: PACU Anesthesia Type: General Level of consciousness: awake and alert Pain management: pain level controlled Vital Signs Assessment: post-procedure vital signs reviewed and stable Respiratory status: spontaneous breathing and respiratory function stable Cardiovascular status: stable Anesthetic complications: no     Last Vitals:  Vitals:   12/16/18 1610 12/16/18 1631  BP: 137/81 (!) 141/88  Pulse: (!) 108 95  Resp: 19 18  Temp:  (!) 36.4 C  SpO2: 96% 96%    Last Pain:  Vitals:   12/16/18 1631  TempSrc: Temporal  PainSc: 0-No pain                 Jessamine Barcia K

## 2019-01-20 ENCOUNTER — Other Ambulatory Visit: Payer: Self-pay

## 2019-01-20 ENCOUNTER — Ambulatory Visit
Admission: RE | Admit: 2019-01-20 | Discharge: 2019-01-20 | Disposition: A | Discharge status: Home / Self Care | Payer: 59 | Source: Ambulatory Visit | Attending: Gerontology | Admitting: Gerontology

## 2019-01-20 ENCOUNTER — Encounter: Payer: Self-pay | Admitting: Intensive Care

## 2019-01-20 ENCOUNTER — Other Ambulatory Visit: Payer: Self-pay | Admitting: Gerontology

## 2019-01-20 DIAGNOSIS — R197 Diarrhea, unspecified: Secondary | ICD-10-CM | POA: Insufficient documentation

## 2019-01-20 DIAGNOSIS — Z79899 Other long term (current) drug therapy: Secondary | ICD-10-CM | POA: Diagnosis not present

## 2019-01-20 DIAGNOSIS — R112 Nausea with vomiting, unspecified: Secondary | ICD-10-CM

## 2019-01-20 DIAGNOSIS — R1031 Right lower quadrant pain: Secondary | ICD-10-CM | POA: Diagnosis not present

## 2019-01-20 DIAGNOSIS — J45909 Unspecified asthma, uncomplicated: Secondary | ICD-10-CM | POA: Insufficient documentation

## 2019-01-20 DIAGNOSIS — K920 Hematemesis: Secondary | ICD-10-CM | POA: Insufficient documentation

## 2019-01-20 LAB — COMPREHENSIVE METABOLIC PANEL
ALT: 33 U/L (ref 0–44)
AST: 27 U/L (ref 15–41)
Albumin: 4.1 g/dL (ref 3.5–5.0)
Alkaline Phosphatase: 63 U/L (ref 38–126)
Anion gap: 12 (ref 5–15)
BUN: 8 mg/dL (ref 6–20)
CO2: 25 mmol/L (ref 22–32)
Calcium: 9.5 mg/dL (ref 8.9–10.3)
Chloride: 100 mmol/L (ref 98–111)
Creatinine, Ser: 0.95 mg/dL (ref 0.44–1.00)
GFR calc Af Amer: >60 mL/min (ref >60)
GFR calc non Af Amer: >60 mL/min (ref >60)
Glucose, Bld: 94 mg/dL (ref 70–99)
Potassium: 4.2 mmol/L (ref 3.5–5.1)
Sodium: 137 mmol/L (ref 135–145)
Total Bilirubin: 0.7 mg/dL (ref 0.3–1.2)
Total Protein: 8.1 g/dL (ref 6.5–8.1)

## 2019-01-20 LAB — URINALYSIS, COMPLETE (UACMP) WITH MICROSCOPIC
Bacteria, UA: NONE SEEN
Bilirubin Urine: NEGATIVE
Glucose, UA: NEGATIVE mg/dL
Hgb urine dipstick: NEGATIVE
Ketones, ur: NEGATIVE mg/dL
Leukocytes,Ua: NEGATIVE
Nitrite: NEGATIVE
Protein, ur: NEGATIVE mg/dL
Specific Gravity, Urine: 1.034 — ABNORMAL HIGH (ref 1.005–1.030)
pH: 5 (ref 5.0–8.0)

## 2019-01-20 LAB — CBC
HCT: 37.6 % (ref 36.0–46.0)
Hemoglobin: 12.5 g/dL (ref 12.0–15.0)
MCH: 24.3 pg — ABNORMAL LOW (ref 26.0–34.0)
MCHC: 33.2 g/dL (ref 30.0–36.0)
MCV: 73 fL — ABNORMAL LOW (ref 80.0–100.0)
Platelets: 257 10*3/uL (ref 150–400)
RBC: 5.15 MIL/uL — ABNORMAL HIGH (ref 3.87–5.11)
RDW: 13.6 % (ref 11.5–15.5)
WBC: 10.8 10*3/uL — ABNORMAL HIGH (ref 4.0–10.5)
nRBC: 0 % (ref 0.0–0.2)

## 2019-01-20 LAB — LIPASE, BLOOD: Lipase: 30 U/L (ref 11–51)

## 2019-01-20 MED ORDER — IOHEXOL 300 MG/ML  SOLN
125.0000 mL | Freq: Once | INTRAMUSCULAR | Status: AC | PRN
  Administered 2019-01-20: 125 mL via INTRAVENOUS

## 2019-01-20 MED ORDER — ONDANSETRON 4 MG PO TBDP
4.0000 mg | ORAL_TABLET | Freq: Once | ORAL | Status: AC
  Administered 2019-01-20: 22:00:00 4 mg via ORAL
  Filled 2019-01-20: qty 1

## 2019-01-20 NOTE — ED Triage Notes (Signed)
Pt c/o abd pain with nausea and emesis X2 weeks. Pt came to ER tonight because she was grocery shopping and a wave of nausea hit her and she reports throwing up blood. Reports having CT scan completed today and no results called back to patient.

## 2019-01-21 ENCOUNTER — Emergency Department
Admission: EM | Admit: 2019-01-21 | Discharge: 2019-01-21 | Disposition: A | Discharge status: Home / Self Care | Payer: 59 | Attending: Emergency Medicine | Admitting: Emergency Medicine

## 2019-01-21 DIAGNOSIS — R112 Nausea with vomiting, unspecified: Secondary | ICD-10-CM

## 2019-01-21 HISTORY — DX: Reserved for concepts with insufficient information to code with codable children: IMO0002

## 2019-01-21 MED ORDER — PROCHLORPERAZINE EDISYLATE 10 MG/2ML IJ SOLN
10.0000 mg | Freq: Once | INTRAMUSCULAR | Status: AC
  Administered 2019-01-21: 10 mg via INTRAVENOUS
  Filled 2019-01-21: qty 2

## 2019-01-21 MED ORDER — DICYCLOMINE HCL 20 MG PO TABS: 20.0000 mg | ORAL_TABLET | Freq: Three times a day (TID) | ORAL | 0 refills | Status: AC | PRN

## 2019-01-21 MED ORDER — PROCHLORPERAZINE MALEATE 10 MG PO TABS: 10.0000 mg | ORAL_TABLET | Freq: Three times a day (TID) | ORAL | 0 refills | Status: AC | PRN

## 2019-01-21 MED ORDER — FAMOTIDINE 20 MG PO TABS: 20.0000 mg | ORAL_TABLET | Freq: Every day | ORAL | 1 refills | Status: DC

## 2019-01-21 MED ORDER — SODIUM CHLORIDE 0.9 % IV BOLUS
1000.0000 mL | Freq: Once | INTRAVENOUS | Status: AC
  Administered 2019-01-21: 1000 mL via INTRAVENOUS

## 2019-01-21 NOTE — ED Notes (Addendum)
Fluids and blankets provided, pt went to toilet after some diarrhea in bed, linen changed and underwear provided, IV restarted  EDP notified of start of PO challenge

## 2019-01-21 NOTE — ED Notes (Addendum)
Pt ambulatory to toilet. Bed linen changed. Call light within reach. Pt has no further needs at this time.

## 2019-01-21 NOTE — ED Notes (Signed)
Patient up to stat desk upset about the wait time, attempted to explain but patient talked over this RN and left to sit back down in waiting room.

## 2019-01-21 NOTE — ED Notes (Addendum)
Pt c/o N and V for the last 2 weeks, pt reports s/p hysterectomy 1 month ago, denies diarrhea or hx of gastritis, pt reports hx of anx/dprx and HTN  Pt reports last meal as baked potato Thursday night

## 2019-01-21 NOTE — ED Notes (Signed)
Family at bedside. 

## 2019-01-21 NOTE — ED Notes (Signed)
Peripheral IV discontinued. Catheter intact. No signs of infiltration or redness. Gauze applied to IV site.    Discharge instructions reviewed with patient. Questions fielded by this RN. Patient verbalizes understanding of instructions. Patient discharged home in stable condition per goodman. No acute distress noted at time of discharge.    
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
## 2019-01-21 NOTE — Discharge Instructions (Addendum)
Please seek medical attention for any high fevers, chest pain, shortness of breath, change in behavior, persistent vomiting, bloody stool or any other new or concerning symptoms.  

## 2019-01-31 ENCOUNTER — Telehealth: Payer: Self-pay | Admitting: Gastroenterology

## 2019-01-31 NOTE — Telephone Encounter (Signed)
Left vm to offer Ed f/u apt with Dr. Vanga °

## 2019-03-03 DIAGNOSIS — U071 COVID-19: Secondary | ICD-10-CM

## 2019-03-03 HISTORY — DX: COVID-19: U07.1

## 2019-07-01 ENCOUNTER — Telehealth (INDEPENDENT_AMBULATORY_CARE_PROVIDER_SITE_OTHER): Payer: No Typology Code available for payment source | Admitting: Psychiatry

## 2019-07-01 ENCOUNTER — Other Ambulatory Visit: Payer: Self-pay

## 2019-07-01 ENCOUNTER — Encounter (HOSPITAL_COMMUNITY): Payer: Self-pay | Admitting: Psychiatry

## 2019-07-01 DIAGNOSIS — F411 Generalized anxiety disorder: Secondary | ICD-10-CM | POA: Diagnosis not present

## 2019-07-01 DIAGNOSIS — F4310 Post-traumatic stress disorder, unspecified: Secondary | ICD-10-CM | POA: Diagnosis not present

## 2019-07-01 DIAGNOSIS — F332 Major depressive disorder, recurrent severe without psychotic features: Secondary | ICD-10-CM

## 2019-07-01 MED ORDER — BUPROPION HCL ER (XL) 150 MG PO TB24: 150.0000 mg | ORAL_TABLET | ORAL | 2 refills | Status: DC

## 2019-07-01 MED ORDER — LORAZEPAM 1 MG PO TABS: 1.0000 mg | ORAL_TABLET | Freq: Three times a day (TID) | ORAL | 2 refills | Status: DC | PRN

## 2019-07-01 MED ORDER — SERTRALINE HCL 100 MG PO TABS: 200.0000 mg | ORAL_TABLET | Freq: Every day | ORAL | 2 refills | Status: DC

## 2019-07-01 MED ORDER — PRAZOSIN HCL 5 MG PO CAPS: 5.0000 mg | ORAL_CAPSULE | Freq: Every day | ORAL | 2 refills | Status: DC

## 2019-07-01 MED ORDER — TRAZODONE HCL 50 MG PO TABS: 50.0000 mg | ORAL_TABLET | Freq: Every day | ORAL | 2 refills | Status: DC
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
## 2019-07-05 ENCOUNTER — Other Ambulatory Visit: Payer: Self-pay

## 2019-07-05 ENCOUNTER — Ambulatory Visit
Admission: EM | Admit: 2019-07-05 | Discharge: 2019-07-05 | Disposition: A | Discharge status: Home / Self Care | Payer: No Typology Code available for payment source | Attending: Emergency Medicine | Admitting: Emergency Medicine

## 2019-07-05 DIAGNOSIS — H66004 Acute suppurative otitis media without spontaneous rupture of ear drum, recurrent, right ear: Secondary | ICD-10-CM

## 2019-07-05 DIAGNOSIS — R590 Localized enlarged lymph nodes: Secondary | ICD-10-CM | POA: Diagnosis present

## 2019-07-05 DIAGNOSIS — H66001 Acute suppurative otitis media without spontaneous rupture of ear drum, right ear: Secondary | ICD-10-CM | POA: Insufficient documentation

## 2019-07-05 DIAGNOSIS — J029 Acute pharyngitis, unspecified: Secondary | ICD-10-CM | POA: Diagnosis present

## 2019-07-05 DIAGNOSIS — Z20822 Contact with and (suspected) exposure to covid-19: Secondary | ICD-10-CM | POA: Insufficient documentation

## 2019-07-05 LAB — SARS CORONAVIRUS 2 (TAT 6-24 HRS): SARS Coronavirus 2: NEGATIVE

## 2019-07-05 LAB — GROUP A STREP BY PCR: Group A Strep by PCR: NOT DETECTED

## 2019-07-05 MED ORDER — FLUCONAZOLE 150 MG PO TABS
ORAL_TABLET | ORAL | 0 refills | Status: DC
Start: 2019-07-05 — End: 2019-10-17

## 2019-07-05 MED ORDER — DOXYCYCLINE HYCLATE 100 MG PO CAPS
100.0000 mg | ORAL_CAPSULE | Freq: Two times a day (BID) | ORAL | 0 refills | Status: DC
Start: 2019-07-05 — End: 2019-10-17

## 2019-07-05 MED ORDER — PREDNISONE 20 MG PO TABS
40.0000 mg | ORAL_TABLET | Freq: Every day | ORAL | 0 refills | Status: DC
Start: 2019-07-05 — End: 2019-10-17
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
## 2019-07-05 NOTE — Discharge Instructions (Signed)
It was very nice seeing you today in clinic. Thank you for entrusting me with your care.   Rest and Stay HYDRATED. Water and electrolyte containing beverages (Gatorade, Pedialyte) are best to prevent dehydration and electrolyte abnormalities. Recommended warm salt water gargles, hard candies/lozenges, and hot tea with honey/lemon to help soothe the throat and reduce irritation. May use Tylenol and/or Ibuprofen as needed for pain/fever.   You were tested for SARS-CoV-2 (novel coronavirus) today. Testing is being processed at the main campus of [facility] in [place], and have been taking 12-24 hours to come back. Current recommendations from the the CDC and  DHHS require that you remain out of work in order to quarantine at home until negative test results are have been received. In the event that your test results are positive, you will be contacted with further directives. These measures are being implemented out of an abundance of caution to prevent transmission and spread during the current SARS-CoV-2 pandemic.  Make arrangements to follow up with your regular doctor in 1 week for re-evaluation if not improving. If your symptoms/condition worsens, please seek follow up care either here or in the ER. Please remember, our [facility] providers are "right here with you" when you need us.   Again, it was my pleasure to take care of you today. Thank you for choosing our clinic. I hope that you start to feel better quickly.   Makayia Duplessis, MSN, APRN, FNP-C, CEN Advanced Practice Provider [facility] [place] [facility] 

## 2019-07-05 NOTE — ED Triage Notes (Addendum)
Pt presents with c/o cough, nasal congestion and sore throat over the past 3 days. Pt today has headache, emesis and reports swollen tonsils. Pt has taken Ibuprofen today and reports her tonsils are not as swollen. Pt also has been taking Xyzal for congestion with no relief. Pt has not taken any OTC meds for the cough. Pt denies fever/chills, diarrhea, wheeze, shob or other symptoms. Pt reports she does have history of allergies and asthma. Pt states she did have COVID this past January. Pt states her symptoms feels similar to when she had mono this past December. Pt did have the first COVID vaccine, prior to having COVID.

## 2019-07-07 ENCOUNTER — Ambulatory Visit (INDEPENDENT_AMBULATORY_CARE_PROVIDER_SITE_OTHER): Payer: No Typology Code available for payment source | Admitting: Licensed Clinical Social Worker

## 2019-07-07 DIAGNOSIS — F4310 Post-traumatic stress disorder, unspecified: Secondary | ICD-10-CM

## 2019-07-07 DIAGNOSIS — F411 Generalized anxiety disorder: Secondary | ICD-10-CM | POA: Diagnosis not present
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
## 2019-07-13 ENCOUNTER — Other Ambulatory Visit: Payer: Self-pay

## 2019-07-13 ENCOUNTER — Ambulatory Visit (HOSPITAL_COMMUNITY)
Admission: EM | Admit: 2019-07-13 | Discharge: 2019-07-13 | Disposition: A | Discharge status: Home / Self Care | Payer: No Typology Code available for payment source | Attending: Family Medicine | Admitting: Family Medicine

## 2019-07-13 ENCOUNTER — Encounter (HOSPITAL_COMMUNITY): Payer: Self-pay

## 2019-07-13 DIAGNOSIS — R319 Hematuria, unspecified: Secondary | ICD-10-CM

## 2019-07-13 DIAGNOSIS — R109 Unspecified abdominal pain: Secondary | ICD-10-CM

## 2019-07-13 LAB — POCT URINALYSIS DIP (DEVICE)
Bilirubin Urine: NEGATIVE
Glucose, UA: NEGATIVE mg/dL
Ketones, ur: NEGATIVE mg/dL
Leukocytes,Ua: NEGATIVE
Nitrite: NEGATIVE
Protein, ur: NEGATIVE mg/dL
Specific Gravity, Urine: 1.02 (ref 1.005–1.030)
Urobilinogen, UA: 0.2 mg/dL (ref 0.0–1.0)
pH: 8 (ref 5.0–8.0)

## 2019-07-13 MED ORDER — KETOROLAC TROMETHAMINE 60 MG/2ML IM SOLN
60.0000 mg | Freq: Once | INTRAMUSCULAR | Status: AC
  Administered 2019-07-13: 60 mg via INTRAMUSCULAR

## 2019-07-13 MED ORDER — KETOROLAC TROMETHAMINE 60 MG/2ML IM SOLN
INTRAMUSCULAR | Status: AC
  Filled 2019-07-13: qty 2

## 2019-07-13 NOTE — Discharge Instructions (Addendum)
Believe that your symptoms are related to a kidney stone.  We are giving a Toradol here for pain Make sure that you are drinking plenty of water.  No concern for pyelonephritis or kidney infection at this time. You can take 800 mg of ibuprofen every 8 hours and your nausea medicine you have at home as needed. If symptoms worsen then go to the ER

## 2019-07-13 NOTE — ED Triage Notes (Signed)
Pt reports right flank pain x 1 day, hematuria 20 min ago. Pt reports the urine was red 1 hr ago approx. Pt reports the tissue she used to whipped had some blood.

## 2019-07-15 ENCOUNTER — Encounter: Payer: Self-pay | Admitting: Emergency Medicine

## 2019-07-15 ENCOUNTER — Other Ambulatory Visit: Payer: Self-pay

## 2019-07-15 ENCOUNTER — Ambulatory Visit
Admission: EM | Admit: 2019-07-15 | Discharge: 2019-07-15 | Disposition: A | Discharge status: Home / Self Care | Payer: No Typology Code available for payment source | Attending: Family Medicine | Admitting: Family Medicine

## 2019-07-15 DIAGNOSIS — S39012A Strain of muscle, fascia and tendon of lower back, initial encounter: Secondary | ICD-10-CM

## 2019-07-15 LAB — URINALYSIS, COMPLETE (UACMP) WITH MICROSCOPIC
Bilirubin Urine: NEGATIVE
Glucose, UA: NEGATIVE mg/dL
Ketones, ur: NEGATIVE mg/dL
Leukocytes,Ua: NEGATIVE
Nitrite: NEGATIVE
Protein, ur: NEGATIVE mg/dL
Specific Gravity, Urine: 1.025 (ref 1.005–1.030)
WBC, UA: NONE SEEN WBC/hpf (ref 0–5)
pH: 5.5 (ref 5.0–8.0)

## 2019-07-15 MED ORDER — CYCLOBENZAPRINE HCL 10 MG PO TABS: 10.0000 mg | ORAL_TABLET | Freq: Every day | ORAL | 0 refills | Status: AC

## 2019-07-15 NOTE — Discharge Instructions (Signed)
Rest, ibuprofen/tylenol as needed, heat to the affected low back area

## 2019-07-15 NOTE — ED Triage Notes (Signed)
Patient was diagnosed with a kidney stone on Thursday.  Patient states that her pain in her right lower back became severe about 2 hours ago.
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
## 2019-08-03 ENCOUNTER — Other Ambulatory Visit: Payer: Self-pay

## 2019-08-03 ENCOUNTER — Ambulatory Visit: Payer: Managed Care, Other (non HMO) | Admitting: Licensed Clinical Social Worker

## 2019-08-11 ENCOUNTER — Other Ambulatory Visit: Payer: Self-pay

## 2019-08-11 ENCOUNTER — Ambulatory Visit (INDEPENDENT_AMBULATORY_CARE_PROVIDER_SITE_OTHER): Payer: No Typology Code available for payment source | Admitting: Licensed Clinical Social Worker

## 2019-08-11 DIAGNOSIS — F4310 Post-traumatic stress disorder, unspecified: Secondary | ICD-10-CM | POA: Diagnosis not present

## 2019-08-11 NOTE — Patient Instructions (Addendum)
Caring for Your Mental Health Mental health is emotional, psychological, and social well-being. Mental health is just as important as physical health. In fact, mental and physical health are connected, and you need both to be healthy. Some signs of good mental health (well-being) include:  Being able to attend to tasks at home, school, or work.  Being able to manage stress and emotions.  Practicing self-care, which may include: ? A regular exercise pattern. ? A reasonably healthy diet. ? Supportive and trusting relationships. ? The ability to relax and calm yourself (self-calm).  Having pleasurable hobbies and activities to do.  Believing that you have meaning and purpose in your life.  Recovering and adjusting after facing challenges (resilience). You can take steps to build or strengthen these mentally healthy behaviors. There are resources and support to help you with this. Why is caring for mental health important? Caring for your mental health is a big part of staying healthy. Everyone has times when feelings, thoughts, or situations feel overwhelming. Mental health means having the skills to manage what feels overwhelming. If this sense of being overwhelmed persists, however, you might need some help. If you have some of the following signs, you may need to take better care of your mental health or seek help from a health care provider or mental health professional:  Problems with energy or focus.  Changes in eating habits.  Problems sleeping, such as sleeping too much or not enough.  Emotional distress, such as anger, sadness, depression, or anxiety.  Major changes in your relationships.  Losing interest in life or activities that you used to enjoy. If you have any of these symptoms on most days for 2 weeks or longer:  Talk with a close friend or family member about how you are feeling.  Contact your health care provider to discuss your symptoms.  Consider working with a  Education officer, community. Your health care provider, family, or friends may be able to recommend a therapist. What can I do to promote emotional and mental health? Managing emotions  Learn to identify emotions and deal with them. Recognizing your emotions is the first step in learning to deal with them.  Practice ways to appropriately express feelings. Remember that you can control your feelings. They do not control you.  Practice stress management techniques, such as: ? Relaxation techniques, like breathing or muscle relaxation exercises. ? Exercise. Regular activity can lower your stress level. ? Changing what you can change and accepting what you cannot change.  Build up your resilience so that you can recover and adjust after big problems or challenges. Practice resilient behaviors and attitudes: ? Set and focus on long-term goals. ? Develop and maintain healthy, supportive relationships. ? Learn to accept change and make the best of the situation. ? Take care of yourself physically by eating a healthy diet, getting plenty of sleep, and exercising regularly. ? Develop self-awareness. Ask others to give feedback about how they see you. ? Practice mindfulness meditation to help you stay calm when dealing with daily challenges. ? Learn to respond to situations in healthy ways, rather than reacting with your emotions. ? Keep a positive attitude, and believe in yourself. Your view of yourself affects your mental health. ? Develop your listening and empathy skills. These will help you deal with difficult situations and communications.  Remember that emotions can be used as a good source of communication and are a great source of energy. Try to laugh and find humor in life.  Sleeping  Get the right amount and quality of sleep. Sleep has a big impact on physical and mental health. To improve your sleep: ? Go to bed and wake up around the same time every day. ? Limit screen time before  bedtime. This includes the use of your cell phone, TV, computer, and tablet. ? Keep your bedroom dark and cool. Activity   Exercise or do some physical activity regularly. This helps: ? Keep your body strong, especially during times of stress. ? Get rid of chemicals in your body (hormones) that build up when you are stressed. ? Build up your resilience. Eating and drinking   Eat a healthy diet that includes whole grains, vegetables, fresh fruits, and lean proteins. If you have questions about what foods are best for you, ask your health care provider.  Try not to turn to sweet, salty, or otherwise unhealthy foods when you are tired or unhappy. This can lead to unwanted weight gain and is not a healthy way to cope with emotions. Where to find more information You can find more information about how to care for your mental health from:  The [employer] on Mental Illness (NAMI): www.nami.[employer] of Mental Health: http://www.maynard.net/  Centers for Disease Control and Prevention: https://www.washington.net/ Contact a health care provider if:  You lose interest in being with others or you do not want to leave the house.  You have a hard time completing your normal activities or you have less energy than normal.  You cannot stay focused or you have problems with memory.  You feel that your senses are heightened, and this makes you upset or concerned.  You feel nervous or have rapid mood changes.  You are sleeping or eating more or less than normal.  You question reality or you show odd behavior that disturbs you or others. Get help right away if:  You have thoughts about hurting yourself or others. If you ever feel like you may hurt yourself or others, or have thoughts about taking your own life, get help right away. You can go to your nearest emergency department or call:  Your local emergency services (911 in the [place]  A suicide crisis helpline, such as the  National Suicide Prevention Lifeline at 816-421-9146. This is open 24 hours a day. Summary  Mental health is not just the absence of mental illness. It involves understanding your emotions and behaviors, and taking steps to cope with them in a healthy way.  If you have symptoms of mental or emotional distress, get help from family, friends, a health care provider, or a mental health professional.  Practice good mental health behaviors such as stress management skills, self-calming skills, exercise, and healthy sleeping and eating. This information is not intended to replace advice given to you by your health care provider. Make sure you discuss any questions you have with your health care provider. Document Revised: 01/29/2017 Document Reviewed: 06/30/2016 Elsevier Patient Education  2020 [employer].  Supporting Someone With an Addiction Addiction is a condition that causes an uncontrollable (compulsive) need for a substance or behavior. A person can be addicted to alcohol, illegal drugs, or prescription medicines, such as painkillers. An addiction can also be to a behavior, like gambling or shopping. Addiction can change the way a person's brain works. The need for the drug or activity can become so strong that the person thinks about it all the time or becomes physically dependent on it. When a person has an addiction,  his or her condition can affect others, such as friends and family members. Friends and family can help by offering support and understanding. What do I need to know about addiction? Addiction can cause problems with mental and physical health. It can affect a person's ability to have healthy relationships and to meet responsibilities at home and at work or school. Signs of addiction may include:  An intense craving for a drug or activity.  Always thinking about the addiction.  Planning life activities around the addiction.  Being unable to stop using a drug or  participating in a behavior.  Devoting more time to the addiction than to other responsibilities, like school, work, or family.  An increasing need for money. An addiction might lead a person to ask for unusual loans or steal items to sell.  An exaggerated response to difficult situations, such as: ? Extreme anxiety or irritability. ? Aggression. ? Lying.  Having trouble being realistic about the negative effects of a drug or activity. What do I need to know about the treatment options? Treatment for addiction and recovery can be a long process. Your loved one's treatment may involve:  Stopping substance use safely. This may require taking medicines and being closely observed for several days.  Group or individual counseling from mental health providers. Your loved one may attend daily counseling sessions at a treatment center.  Medicines to treat the addiction.  Going to a support group. These groups are an important part of long-term recovery for many people. They include 12-step programs like Alcoholics Anonymous (AA), Narcotics Anonymous (NA), or Gamblers Anonymous (GA). Many people who undergo treatment start the addiction again after stopping. This is called a relapse. If your loved one has a relapse, that does not mean that treatment will not work. Keep in mind that:  This disorder involves the brain, the body, and the people in a person's life (social system). Changing unhealthy behaviors is a complex process that requires determination from your loved one.  Your loved one may need to try several times to recover.  Your support is important in helping your loved one to recover. A responsible adult may need to stay with your loved one for some time after treatment. This person can help your loved one stay on track with recovery and can watch for symptoms that are getting worse. How can I support my loved one? Talk about the addiction  Be careful about too much prodding. Try  not to overdo reminders to an adult friend or family member about things like taking medicines. Ask how your loved one prefers that you help.  Explain that it is not easy to quit because addiction can change the part of the brain that gives someone self-control. Also, some people can easily become addicted because of their family genes.  Never ignore comments about suicide, and do not try to avoid the subject of suicide. Talking about suicide will not make your loved one want to act on it. You or your loved one can reach out 24 hours a day to get free, private support (on the phone or a live online chat) from a suicide crisis helpline, such as the National Suicide Prevention Lifeline at 4246450702. Ask your loved one if you can go with him or her to meet with his or her health care provider or therapist.  Ask if your loved one is open to giving you written permission to communicate with his or her providers if needed. Find support and resources  Work with a health care provider who specializes in addiction.  Refer your loved one to trusted online resources that can provide information about addiction. A health care provider may be able to recommend resources. You could start with: ? Government sites such as the Substance Abuse and Museum/gallery exhibitions officer [facility]): SkateOasis.com.pt ? National mental health organizations such as the [employer] on Mental Illness (NAMI): www.nami.org  Look into local support groups or 12-step programs for your loved one.  Connect with people in peer and family support groups. People in these groups understand what you and your loved one are going through. They can help you feel a sense of comfort and connect you with local resources to help you learn more. General support  Make an effort to learn all you can about substance use disorder.  Help your loved one follow his or her treatment plan as directed by health care providers. This could  mean driving him or her to therapy sessions or support group meetings.  Tell your loved one that you will keep giving support as long as he or she follows the treatment plan.  Assure your loved one that even though treatment may be hard, it often works. Substance use disorder can be managed.  Encourage your loved one to avoid things, people, and situations (triggers) that may cause a relapse.  Talk with your loved one's treatment center staff or health care provider about how you can keep supporting your loved one during treatment, recovery, and relapses if necessary. Follow these instructions at home: Safety  Talk with your loved one's health care provider about ways he or she can stay safe. This may include: ? Vaccinations. ? Medicines to prevent death from overdose. ? Referrals for a clean needle exchange program. ? Sexual health counseling.  If you believe that your loved one is driving while using drugs or alcohol, it is important to confront him or her about the dangers of driving while drunk or high. In some cases, you may need to call the police to prevent harm to your loved one or others. Lifestyle  Find ways to care for your body, mind, and well-being while supporting someone with an addiction, such as: ? Eat a healthy diet, exercise regularly, and get plenty of sleep. ? Join a support group for family members of people with addiction. ? Seek individual therapy to help you learn to cope with your loved one's disorder. ? Try to maintain your normal routines. ? Make time for activities that help you relax, and try to not feel guilty about taking time for yourself. What are some signs that the addiction is getting worse?  Continuing to use more and more of a drug or do more and more of an activity over time.  Continuing the drug or activity even after using it has had negative consequences such as health problems or job loss.  Having physical symptoms when he or she tries to  quit (withdrawal). Symptoms depend on the drug or substance, but general symptoms may include: ? Nausea or vomiting. ? Restlessness and irritability. ? Sweating.  A feeling in you that you are powerless to help your loved one get better. Get help right away if:  Your loved one is showing signs that he or she is thinking about hurting himself, herself, or someone else. If you ever feel like your loved one may hurt himself or herself or others, or may have thoughts about taking his or her own life, get help  right away. You can go to your nearest emergency department or call:  Your local emergency services (911 in the [place]  A suicide crisis helpline, such as the [place] at 470-717-7626. This is open 24 hours a day. Summary  Addiction is a condition that causes an uncontrollable (compulsive) need for a substance or behavior.  Treatment for addiction may include group or individual counseling and support groups.  Many people who undergo treatment start the addiction again after stopping. If your loved one has a relapse, that does not mean that treatment will not work.  Find ways to care for your body, mind, and well-being while supporting someone with an addiction.  If you ever feel like your loved one may hurt himself or herself or others, or may have thoughts about taking his or her own life, get help right away. This information is not intended to replace advice given to you by your health care provider. Make sure you discuss any questions you have with your health care provider. Document Revised: 11/11/2018 Document Reviewed: 03/31/2017 Elsevier Patient Education  2020 [employer].

## 2019-08-11 NOTE — Progress Notes (Signed)
Virtual Visit via Video Note  I connected with Christina Hill on 08/11/19 at  9:00 AM EDT by a video enabled telemedicine application and verified that I am speaking with the correct person using two identifiers.  Location: Patient: home Provider: [facility]   I discussed the limitations of evaluation and management by telemedicine and the availability of in person appointments. The patient expressed understanding and agreed to proceed.   I discussed the assessment and treatment plan with the patient. The patient was provided an opportunity to ask questions and all were answered. The patient agreed with the plan and demonstrated an understanding of the instructions.   The patient was advised to call back or seek an in-person evaluation if the symptoms worsen or if the condition fails to improve as anticipated.  I provided 60 minutes of non-face-to-face time during this encounter.   Marvelous Bouwens R Ralphine Hinks, LCSW    THERAPIST PROGRESS NOTE  Session Time: 60 min  Participation Level: Active  Behavioral Response: NeatAlertAnxious  Type of Therapy: Individual Therapy  Treatment Goals addressed: Anxiety and Coping  Interventions: Supportive  Summary: Christina Hill is a 24 y.o. female who presents with continuing depression symptoms and anxiety/stress associated with brother's continuing struggle with addiction.  Allowed pt to explore and express thoughts and feelings associated with multiple situations that have happened recently involving her brother and other family members. Pt feels that situation is impacting her social functioning, academic functioning, and day-to-day activities. Discussed importance of setting healthy boundaries with brother and other family members to help mitigate overall anxiety/stress associated with brother's addiction. Recommended pt research local support groups--NA and [place]. Discussed importance of self-care, setting boundaries and overall life  balance.   Suicidal/Homicidal: No  Therapist Response: Christina Hill is continuing to develop skills to regulate her emotions and regulate mood.   Plan: Return again in 3 weeks.  Diagnosis: Axis I: Post Traumatic Stress Disorder    Axis II: No diagnosis    Ernest Haber Atisha Hamidi, LCSW 08/11/2019

## 2019-08-30 ENCOUNTER — Ambulatory Visit (INDEPENDENT_AMBULATORY_CARE_PROVIDER_SITE_OTHER): Payer: Managed Care, Other (non HMO) | Admitting: Licensed Clinical Social Worker

## 2019-08-30 ENCOUNTER — Other Ambulatory Visit: Payer: Self-pay

## 2019-08-30 DIAGNOSIS — F411 Generalized anxiety disorder: Secondary | ICD-10-CM

## 2019-08-30 DIAGNOSIS — F4310 Post-traumatic stress disorder, unspecified: Secondary | ICD-10-CM

## 2019-08-30 NOTE — Progress Notes (Signed)
Virtual Visit via Video Note  I connected with Christina Hill on 08/30/19 at 12:30 PM EDT by a video enabled telemedicine application and verified that I am speaking with the correct person using two identifiers.  Location: Patient: home  Provider: [facility]   I discussed the limitations of evaluation and management by telemedicine and the availability of in person appointments. The patient expressed understanding and agreed to proceed.  I discussed the assessment and treatment plan with the patient. The patient was provided an opportunity to ask questions and all were answered. The patient agreed with the plan and demonstrated an understanding of the instructions.   The patient was advised to call back or seek an in-person evaluation if the symptoms worsen or if the condition fails to improve as anticipated.  I provided 45 minutes of non-face-to-face time during this encounter.   Luceil Herrin R Linard Daft, LCSW    THERAPIST PROGRESS NOTE  Session Time: 45 min  Participation Level: Active  Behavioral Response: Neat and Well GroomedAlertAnxious  Type of Therapy: Individual Therapy  Treatment Goals addressed: Anxiety  Interventions: Supportive  Summary: Christina Hill is a 24 y.o. female who presents with improving mood and overall stress.  Pt reports that brother is doing much better--concerns about brother triggered significant stress in previous session.  Allowed pt to explore and express thoughts and feelings associated with brother, and his current struggle w/ addiction. Pt reports brother was truthful about his behaviors, and expressed accountability for his own actions. Pts brother has support group of friends from [place].    Pt expressed that divorce is upcoming--allowed pt to explore and express thoughts and feelings related to that relationship, and the ending of the relationship. Discussed current relationship and pros/cons of long-distance relationship.  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 Discussed how this annual event triggers positive emotions.   Encouraged setting healthy boundaries, focusing on life balance, and self care/overall wellness.      Suicidal/Homicidal: No  Therapist Response: Nehemiah Settle is progressing towards goals of managing stress, managing mood, and utilizing learned coping mechanisms.   Plan: Return again in 4 weeks. LCSW assisted with scheduling follow up OPT appt.  Diagnosis: Axis I: Generalized Anxiety Disorder and Post Traumatic Stress Disorder    Axis II: No diagnosis    Ernest Haber [place], LCSW 08/30/2019

## 2019-09-27 ENCOUNTER — Telehealth: Payer: Self-pay | Admitting: Licensed Clinical Social Worker

## 2019-09-27 ENCOUNTER — Other Ambulatory Visit: Payer: Self-pay

## 2019-09-27 ENCOUNTER — Ambulatory Visit: Payer: Managed Care, Other (non HMO) | Admitting: Licensed Clinical Social Worker

## 2019-10-17 ENCOUNTER — Other Ambulatory Visit (HOSPITAL_COMMUNITY): Payer: Self-pay | Admitting: Psychiatry

## 2019-10-17 ENCOUNTER — Ambulatory Visit
Admission: RE | Admit: 2019-10-17 | Discharge: 2019-10-17 | Disposition: A | Discharge status: Home / Self Care | Payer: No Typology Code available for payment source | Source: Ambulatory Visit | Attending: Family Medicine | Admitting: Family Medicine

## 2019-10-17 ENCOUNTER — Other Ambulatory Visit: Payer: Self-pay

## 2019-10-17 ENCOUNTER — Ambulatory Visit (INDEPENDENT_AMBULATORY_CARE_PROVIDER_SITE_OTHER): Payer: No Typology Code available for payment source

## 2019-10-17 VITALS — BP 136/77 | HR 108 | Temp 98.5°F | Resp 18 | Ht 65.0 in | Wt 320.0 lb

## 2019-10-17 DIAGNOSIS — S93401A Sprain of unspecified ligament of right ankle, initial encounter: Secondary | ICD-10-CM | POA: Diagnosis not present

## 2019-10-17 DIAGNOSIS — S99911A Unspecified injury of right ankle, initial encounter: Secondary | ICD-10-CM

## 2019-10-17 MED ORDER — MELOXICAM 15 MG PO TABS: 15.0000 mg | ORAL_TABLET | Freq: Every day | ORAL | 0 refills | Status: AC | PRN

## 2019-10-17 NOTE — Telephone Encounter (Signed)
Pt was supposed to receive f/u with psych in [facility], please call them

## 2019-10-17 NOTE — ED Triage Notes (Signed)
Patient in today c/o right ankle pain and left knee abrasion after falling in the parking lot at work today.

## 2019-10-17 NOTE — Discharge Instructions (Signed)
Rest, ice, elevate. ° °Medication as prescribed. ° °Take care ° °Dr. Caroleena Paolini  °
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
## 2019-10-20 ENCOUNTER — Other Ambulatory Visit: Payer: Self-pay | Admitting: Physician Assistant

## 2019-10-20 ENCOUNTER — Ambulatory Visit
Admission: RE | Admit: 2019-10-20 | Discharge: 2019-10-20 | Disposition: A | Discharge status: Home / Self Care | Payer: PRIVATE HEALTH INSURANCE | Source: Ambulatory Visit | Attending: Physician Assistant | Admitting: Physician Assistant

## 2019-10-20 ENCOUNTER — Ambulatory Visit
Admission: RE | Admit: 2019-10-20 | Discharge: 2019-10-20 | Disposition: A | Discharge status: Home / Self Care | Payer: Worker's Compensation | Source: Ambulatory Visit | Attending: Physician Assistant | Admitting: Physician Assistant

## 2019-10-20 DIAGNOSIS — W19XXXA Unspecified fall, initial encounter: Secondary | ICD-10-CM | POA: Insufficient documentation

## 2019-10-20 DIAGNOSIS — S299XXA Unspecified injury of thorax, initial encounter: Secondary | ICD-10-CM | POA: Insufficient documentation

## 2019-10-20 DIAGNOSIS — S99922A Unspecified injury of left foot, initial encounter: Secondary | ICD-10-CM | POA: Diagnosis present

## 2019-11-05 ENCOUNTER — Other Ambulatory Visit: Payer: Self-pay

## 2019-11-05 ENCOUNTER — Ambulatory Visit
Admission: RE | Admit: 2019-11-05 | Discharge: 2019-11-05 | Disposition: A | Discharge status: Home / Self Care | Payer: No Typology Code available for payment source | Source: Ambulatory Visit | Attending: Physician Assistant | Admitting: Physician Assistant

## 2019-11-05 VITALS — BP 130/71 | HR 77 | Temp 99.4°F | Resp 14 | Ht 65.0 in | Wt 320.0 lb

## 2019-11-05 DIAGNOSIS — B349 Viral infection, unspecified: Secondary | ICD-10-CM | POA: Diagnosis present

## 2019-11-05 DIAGNOSIS — Z20822 Contact with and (suspected) exposure to covid-19: Secondary | ICD-10-CM

## 2019-11-05 DIAGNOSIS — R0981 Nasal congestion: Secondary | ICD-10-CM | POA: Diagnosis present

## 2019-11-05 NOTE — ED Triage Notes (Signed)
Patient c/o cough, congestion, fatigue sore throat, nasal congestion, diarrhea, and bodyaches that started on Wed.  Patient denies fevers.

## 2019-11-05 NOTE — Discharge Instructions (Signed)

## 2019-11-06 LAB — SARS CORONAVIRUS 2 (TAT 6-24 HRS): SARS Coronavirus 2: NEGATIVE
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
## 2019-12-06 ENCOUNTER — Other Ambulatory Visit (HOSPITAL_COMMUNITY): Payer: Self-pay | Admitting: Psychiatry

## 2019-12-06 ENCOUNTER — Ambulatory Visit
Admission: EM | Admit: 2019-12-06 | Discharge: 2019-12-06 | Disposition: A | Discharge status: Home / Self Care | Payer: No Typology Code available for payment source | Attending: Family Medicine | Admitting: Family Medicine

## 2019-12-06 ENCOUNTER — Other Ambulatory Visit: Payer: Self-pay

## 2019-12-06 DIAGNOSIS — H65192 Other acute nonsuppurative otitis media, left ear: Secondary | ICD-10-CM

## 2019-12-06 DIAGNOSIS — J069 Acute upper respiratory infection, unspecified: Secondary | ICD-10-CM

## 2019-12-06 MED ORDER — DEXAMETHASONE SODIUM PHOSPHATE 10 MG/ML IJ SOLN
10.0000 mg | Freq: Once | INTRAMUSCULAR | Status: AC
  Administered 2019-12-06: 10 mg via INTRAMUSCULAR

## 2019-12-06 MED ORDER — BENZONATATE 100 MG PO CAPS: 100.0000 mg | ORAL_CAPSULE | Freq: Three times a day (TID) | ORAL | 0 refills | Status: AC

## 2019-12-06 MED ORDER — PREDNISONE 10 MG PO TABS: 20.0000 mg | ORAL_TABLET | Freq: Every day | ORAL | 0 refills | Status: AC

## 2019-12-06 MED ORDER — CEFDINIR 300 MG PO CAPS
300.0000 mg | ORAL_CAPSULE | Freq: Two times a day (BID) | ORAL | 0 refills | Status: AC
Start: 2019-12-06 — End: 2019-12-13

## 2019-12-06 NOTE — Telephone Encounter (Signed)
LMOM

## 2019-12-06 NOTE — ED Triage Notes (Signed)
Patient states that she has been having a cough for over a week. States that she has a fever and headache and vomiting that started last night. States that cough worsened last night. States that cough is dry.
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
## 2019-12-06 NOTE — Discharge Instructions (Addendum)
Decadron 10mg  IM in office  Prednisone 20mg  daily x 5 days, start tomorrow  Cefdinir BID x 7 days  Tessalon perles as needed for cough  Follow up as needed

## 2019-12-06 NOTE — Telephone Encounter (Signed)
Call for appt

## 2019-12-25 ENCOUNTER — Encounter (HOSPITAL_COMMUNITY): Payer: Self-pay | Admitting: Psychiatry

## 2019-12-25 ENCOUNTER — Telehealth (INDEPENDENT_AMBULATORY_CARE_PROVIDER_SITE_OTHER): Payer: No Typology Code available for payment source | Admitting: Psychiatry

## 2019-12-25 ENCOUNTER — Other Ambulatory Visit: Payer: Self-pay

## 2019-12-25 DIAGNOSIS — F332 Major depressive disorder, recurrent severe without psychotic features: Secondary | ICD-10-CM

## 2019-12-25 DIAGNOSIS — F4310 Post-traumatic stress disorder, unspecified: Secondary | ICD-10-CM | POA: Diagnosis not present

## 2019-12-25 DIAGNOSIS — F411 Generalized anxiety disorder: Secondary | ICD-10-CM

## 2019-12-25 MED ORDER — SERTRALINE HCL 100 MG PO TABS: 200.0000 mg | ORAL_TABLET | Freq: Every day | ORAL | 2 refills | Status: DC

## 2019-12-25 MED ORDER — PRAZOSIN HCL 5 MG PO CAPS: ORAL_CAPSULE | ORAL | 2 refills | Status: DC

## 2019-12-25 MED ORDER — LORAZEPAM 1 MG PO TABS: 1.0000 mg | ORAL_TABLET | Freq: Three times a day (TID) | ORAL | 2 refills | Status: AC | PRN

## 2019-12-25 MED ORDER — TRAZODONE HCL 50 MG PO TABS: 50.0000 mg | ORAL_TABLET | Freq: Every day | ORAL | 2 refills | Status: AC

## 2019-12-25 MED ORDER — BUPROPION HCL ER (XL) 150 MG PO TB24: ORAL_TABLET | ORAL | 2 refills | Status: DC
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
## 2019-12-28 ENCOUNTER — Ambulatory Visit
Admission: EM | Admit: 2019-12-28 | Discharge: 2019-12-28 | Disposition: A | Discharge status: Home / Self Care | Payer: No Typology Code available for payment source | Attending: Family Medicine | Admitting: Family Medicine

## 2019-12-28 ENCOUNTER — Other Ambulatory Visit: Payer: Self-pay

## 2019-12-28 DIAGNOSIS — S61216A Laceration without foreign body of right little finger without damage to nail, initial encounter: Secondary | ICD-10-CM | POA: Diagnosis not present
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
## 2019-12-28 NOTE — Discharge Instructions (Signed)
Suture out in 7 days.  Keep clean.  Take care  Dr. Adriana Simas

## 2019-12-28 NOTE — ED Triage Notes (Signed)
Pt reports washing cup and cup shattered cut her finger.

## 2020-01-08 ENCOUNTER — Other Ambulatory Visit: Payer: Self-pay

## 2020-01-08 ENCOUNTER — Ambulatory Visit (INDEPENDENT_AMBULATORY_CARE_PROVIDER_SITE_OTHER): Payer: Managed Care, Other (non HMO) | Admitting: Licensed Clinical Social Worker

## 2020-01-08 DIAGNOSIS — F4310 Post-traumatic stress disorder, unspecified: Secondary | ICD-10-CM | POA: Diagnosis not present

## 2020-01-08 DIAGNOSIS — F411 Generalized anxiety disorder: Secondary | ICD-10-CM

## 2020-01-08 NOTE — Progress Notes (Signed)
Virtual Visit via Video Note  I connected with Christina Hill on 01/08/20 at 12:30 PM EST by a video enabled telemedicine application and verified that I am speaking with the correct person using two identifiers.  Location: Patient: work (lunch break) Provider: [facility]   I discussed the limitations of evaluation and management by telemedicine and the availability of in person appointments. The patient expressed understanding and agreed to proceed.  The patient was advised to call back or seek an in-person evaluation if the symptoms worsen or if the condition fails to improve as anticipated.  I provided 30 minutes of non-face-to-face time during this encounter.   Kyrollos Cordell R Newell Frater, LCSW    THERAPIST PROGRESS NOTE  Session Time: 12:30-1:00p  Participation Level: Active  Behavioral Response: Neat and Well GroomedAlertgenerally pleasant and positive  Type of Therapy: Individual Therapy  Treatment Goals addressed: Coping  Interventions: Supportive  Summary: Christina Hill is a 24 y.o. female who presents with improving symptoms related to  PTSD and anxiety diagnosis. Pt reports that she is compliant with medication and that she is getting good quality and quantity of sleep. Pt reports that she is engaging socially with friends and family members.   Allowed pt to explore and express thoughts and feelings associated with recent life events. 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 Allowed pt to share how life has changed since getting the job: pt feels she is able to spend more time with family members, friends, and loved ones. "I get to spend time with my family on thanksgiving and christmas for the first time in several years".  Encouraged pt to continue focusing on self care, life balance, and positive social engagement.   Suicidal/Homicidal: No  SI, HI, or AVH reported at time of  session.  Therapist Response: Christina Hill demonstrates a growing capacity for greater pleasure in relationships and work. Christina Hill reports an enhanced capacity for greater enjoyment of leisure time and self care.  Christina Hill reports a decrease in symptoms, which is reflective of progress. Treatment showing good evolution and development.   Plan: Return again in 8 weeks.The ongoing treatment plan includes maintaining current levels of progress and continuing to build skills to manage mood, improve stress/anxiety management, emotion regulation, distress tolerance, and behavior modification.   Diagnosis: Axis I: Generalized Anxiety Disorder and Post Traumatic Stress Disorder    Axis II: No diagnosis    Ernest Haber Maxamillion Banas, LCSW 01/08/2020

## 2020-03-11 ENCOUNTER — Ambulatory Visit (INDEPENDENT_AMBULATORY_CARE_PROVIDER_SITE_OTHER): Payer: Managed Care, Other (non HMO) | Admitting: Licensed Clinical Social Worker

## 2020-03-11 ENCOUNTER — Other Ambulatory Visit: Payer: Self-pay

## 2020-03-11 DIAGNOSIS — F4310 Post-traumatic stress disorder, unspecified: Secondary | ICD-10-CM | POA: Diagnosis not present

## 2020-03-11 NOTE — Progress Notes (Signed)
Virtual Visit via Video Note  I connected with Christina Hill on 03/11/20 at  1:00 PM EST by a video enabled telemedicine application and verified that I am speaking with the correct person using two identifiers.  Location: Patient: home Provider: [facility]   I discussed the limitations of evaluation and management by telemedicine and the availability of in person appointments. The patient expressed understanding and agreed to proceed.   The patient was advised to call back or seek an in-person evaluation if the symptoms worsen or if the condition fails to improve as anticipated.  I provided 45 minutes of non-face-to-face time during this encounter.   Dwayne Begay R Tanvir Hipple, LCSW    THERAPIST PROGRESS NOTE  Session Time: 1-  Participation Level: Active  Behavioral Response: NAAlertAnxious  Type of Therapy: Individual Therapy  Treatment Goals addressed: Anxiety and Coping  Interventions: CBT and Supportive  Summary: Christina Hill is a 25 y.o. female who presents with improving symptoms related to PTSD diagnosis. Pt reports that overall mood is stable, pt is compliant with medication, and pt is getting good quality and quantity of sleep. Pt reports that she is being intentional about regular positive social engagement.  Allowed pt to explore and express thoughts and feelings about current external stressors:  Family conflict around holidays between two aunts, taking a break from school for a while to focus on working. Pt reports very little stress associated with anything else. Allowed pt to explore the overall psychological impact of the family conflict.   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 Discussed how pt is using Daylio app to track moods and mood/stress fluctuations. Discussed identifying patterns and what to do should  patterns develop.  Encouraged pt to continue focusing and prioritizing self care, positive social engagement, cognitively stimulating activities, and keeping life in balance.  Emailed pt psychoeducational materials about life balance.   Suicidal/Homicidal: No  Therapist Response: Nehemiah Settle continues to make good progress with self understanding and self insight. Developments continue in the areas of family and relational functioning, occupational functioning and achievement, and gains in overall self esteem and confidence. Revised treatment plan.  Plan: Return again in 4 weeks.  Diagnosis: Axis I: Post Traumatic Stress Disorder    Axis II: No diagnosis    Ernest Haber Venie Montesinos, LCSW 03/11/2020

## 2020-03-12 ENCOUNTER — Other Ambulatory Visit (HOSPITAL_COMMUNITY): Payer: Self-pay | Admitting: Psychiatry

## 2020-03-12 ENCOUNTER — Telehealth (HOSPITAL_COMMUNITY): Payer: Self-pay | Admitting: Psychiatry

## 2020-03-12 NOTE — Telephone Encounter (Signed)
Called to schedule f/u appt, lvm 

## 2020-04-08 ENCOUNTER — Ambulatory Visit (INDEPENDENT_AMBULATORY_CARE_PROVIDER_SITE_OTHER): Payer: Managed Care, Other (non HMO) | Admitting: Licensed Clinical Social Worker

## 2020-04-08 ENCOUNTER — Other Ambulatory Visit: Payer: Self-pay

## 2020-04-08 DIAGNOSIS — F4310 Post-traumatic stress disorder, unspecified: Secondary | ICD-10-CM | POA: Diagnosis not present
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
## 2020-04-29 ENCOUNTER — Ambulatory Visit (INDEPENDENT_AMBULATORY_CARE_PROVIDER_SITE_OTHER): Payer: Managed Care, Other (non HMO) | Admitting: Licensed Clinical Social Worker

## 2020-04-29 ENCOUNTER — Other Ambulatory Visit: Payer: Self-pay

## 2020-04-29 DIAGNOSIS — F411 Generalized anxiety disorder: Secondary | ICD-10-CM | POA: Diagnosis not present

## 2020-04-29 DIAGNOSIS — F4310 Post-traumatic stress disorder, unspecified: Secondary | ICD-10-CM
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
## 2020-05-20 ENCOUNTER — Ambulatory Visit (INDEPENDENT_AMBULATORY_CARE_PROVIDER_SITE_OTHER): Payer: Managed Care, Other (non HMO) | Admitting: Licensed Clinical Social Worker

## 2020-05-20 ENCOUNTER — Other Ambulatory Visit: Payer: Self-pay

## 2020-05-20 DIAGNOSIS — F4310 Post-traumatic stress disorder, unspecified: Secondary | ICD-10-CM | POA: Diagnosis not present
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
## 2020-06-17 ENCOUNTER — Ambulatory Visit (INDEPENDENT_AMBULATORY_CARE_PROVIDER_SITE_OTHER): Payer: Managed Care, Other (non HMO) | Admitting: Licensed Clinical Social Worker

## 2020-06-17 ENCOUNTER — Other Ambulatory Visit: Payer: Self-pay

## 2020-06-17 DIAGNOSIS — F4310 Post-traumatic stress disorder, unspecified: Secondary | ICD-10-CM | POA: Diagnosis not present

## 2020-06-17 NOTE — Progress Notes (Signed)
Virtual Visit via Video Note  I connected with Christina Hill on 06/17/20 at  1:00 PM EDT by a video enabled telemedicine application and verified that I am speaking with the correct person using two identifiers.  Location: Patient: home   Provider: remote office [place], [place])  I discussed the limitations of evaluation and management by telemedicine and the availability of in person appointments. The patient expressed understanding and agreed to proceed.  I discussed the assessment and treatment plan with the patient. The patient was provided an opportunity to ask questions and all were answered. The patient agreed with the plan and demonstrated an understanding of the instructions.   The patient was advised to call back or seek an in-person evaluation if the symptoms worsen or if the condition fails to improve as anticipated.  I provided 55 minutes of non-face-to-face time during this encounter.   Christina Quito R Rileigh Kawashima, LCSW    THERAPIST PROGRESS NOTE  Session Time: 1-2p  Participation Level: Active  Behavioral Response: Neat and Well GroomedAlertEuthymic  Type of Therapy: Individual Therapy  Treatment Goals addressed: Anxiety and Coping  Interventions: CBT, Meditation: trauma focus and Other: trauma focused  Summary: Christina Hill is a 25 y.o. female who presents with improving symptoms related to PTSD. Pt reports that overall mood has been stable and that she is managing stress and anxiety well.   Allowed pt to explore recent life events and pt reports that she was very happy with a recent visit with her parents. Pt got to see her parent's new RV and spend quality time with them. Pt is very close with parents and is fearful of something happening to them in the future.   Discussed work-related stress: pt is carrying workload for coworkers that have called out--this is triggering stress for pt. Pt admits that she is using mindfulness skills and breathing  skills for moments where she feels her stress is impacting overall work.  Discussed potential future EMDR  Continued recommendations are as follows: self care behaviors, positive social engagements, focusing on overall work/home/life balance, and focusing on positive physical and emotional wellness.   Suicidal/Homicidal: No  Therapist Response: Christina Hill is able to recall traumatic events without becoming overwhelmed with the negative emotions. Pt is able to identify trauma triggers and is implementing relaxation training as a healthy coping mechanism. Pt is also using positive self talk to coach self through emotional times.These behaviors are reflective of personal growth and progress. Treatment to continue.  Plan: Return again in 4 weeks.  Diagnosis: Axis I: Post Traumatic Stress Disorder    Axis II: No diagnosis    Christina Haber Glorya Bartley, LCSW 06/17/2020

## 2020-06-20 ENCOUNTER — Other Ambulatory Visit (HOSPITAL_COMMUNITY): Payer: Self-pay | Admitting: Psychiatry

## 2020-06-20 NOTE — Telephone Encounter (Signed)
Call for appt

## 2020-07-03 NOTE — Telephone Encounter (Signed)
Spoke with patient and she stated that she switched insurances and her new [facility] is taking over her medications. Per pt she will call pharmacy and let them know.

## 2020-07-15 ENCOUNTER — Ambulatory Visit: Payer: Managed Care, Other (non HMO) | Admitting: Licensed Clinical Social Worker

## 2020-08-05 ENCOUNTER — Ambulatory Visit (INDEPENDENT_AMBULATORY_CARE_PROVIDER_SITE_OTHER): Payer: Managed Care, Other (non HMO) | Admitting: Licensed Clinical Social Worker

## 2020-08-05 ENCOUNTER — Other Ambulatory Visit: Payer: Self-pay

## 2020-08-05 DIAGNOSIS — F4310 Post-traumatic stress disorder, unspecified: Secondary | ICD-10-CM | POA: Diagnosis not present

## 2020-08-05 NOTE — Progress Notes (Signed)
Virtual Visit via Video Note  I connected with Christina Hill on 08/05/20 at 11:00 AM EDT by a video enabled telemedicine application and verified that I am speaking with the correct person using two identifiers.  Location: Patient: home Provider: remote office [place], [place])   I discussed the limitations of evaluation and management by telemedicine and the availability of in person appointments. The patient expressed understanding and agreed to proceed.  I discussed the assessment and treatment plan with the patient. The patient was provided an opportunity to ask questions and all were answered. The patient agreed with the plan and demonstrated an understanding of the instructions.   The patient was advised to call back or seek an in-person evaluation if the symptoms worsen or if the condition fails to improve as anticipated.  I provided 45 minutes of non-face-to-face time during this encounter.   Caitlin Hillmer R Lois Ostrom, LCSW    THERAPIST PROGRESS NOTE  Session Time: 11:15a-12p  Participation Level: Active  Behavioral Response: Neat and Well GroomedAlertAnxious  Type of Therapy: Individual Therapy  Treatment Goals addressed: Anxiety and Coping  Interventions: CBT, Solution Focused and Other: stress management  Summary: Christina Hill is a 25 y.o. female who presents with improving symptoms related to PTSD. Pt reports overall mood is stable and that she is managing stress and anxiety well.   Allowed pt to explore and express thoughts and feelings associated with recent life situations and external stressors.Discussed events in pts life currently: pt has new roommate: Rebecca--they are getting along well and pt feels better sharing space and bills with another person.  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   Allowed pt to express concerns she has about her brother--pt fears that he is continuing to do drugs. "I have no proof" but pt feels behaviors that her brother exhibited when he was using were similar to behaviors she is observing now. Pt has lost 40 lbs and feels like this is a boost to her overall self esteem.   Continued recommendations are as follows: self care behaviors, positive social engagements, focusing on overall work/home/life balance, and focusing on positive physical and emotional wellness.   Suicidal/Homicidal: No  Therapist Response: ookeis able to recall traumatic events without becoming overwhelmed with the negative emotions. Pt is able to identify trauma triggers and is implementing relaxation training as a healthy coping mechanism. Pt is also using positive self talk to coach self through emotional times.These behaviors are reflective of personal growth and progress. Treatment to continue  Plan: Return again in 4 weeks.  Diagnosis: Axis I: Post Traumatic Stress Disorder    Axis II: No diagnosis    Ernest Haber Aviana Shevlin, LCSW 08/05/2020

## 2020-09-17 ENCOUNTER — Ambulatory Visit (INDEPENDENT_AMBULATORY_CARE_PROVIDER_SITE_OTHER): Payer: Managed Care, Other (non HMO) | Admitting: Licensed Clinical Social Worker

## 2020-09-17 ENCOUNTER — Other Ambulatory Visit: Payer: Self-pay

## 2020-09-17 DIAGNOSIS — F411 Generalized anxiety disorder: Secondary | ICD-10-CM | POA: Diagnosis not present

## 2020-09-17 DIAGNOSIS — F4310 Post-traumatic stress disorder, unspecified: Secondary | ICD-10-CM | POA: Diagnosis not present
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
## 2020-10-21 ENCOUNTER — Ambulatory Visit (INDEPENDENT_AMBULATORY_CARE_PROVIDER_SITE_OTHER): Payer: Managed Care, Other (non HMO) | Admitting: Licensed Clinical Social Worker

## 2020-10-21 ENCOUNTER — Other Ambulatory Visit: Payer: Self-pay

## 2020-10-21 DIAGNOSIS — F411 Generalized anxiety disorder: Secondary | ICD-10-CM | POA: Diagnosis not present

## 2020-10-21 DIAGNOSIS — F4310 Post-traumatic stress disorder, unspecified: Secondary | ICD-10-CM | POA: Diagnosis not present

## 2020-10-21 NOTE — Progress Notes (Signed)
Virtual Visit via Video Note  I connected with Christina Hill on 10/21/20 at  4:00 PM EDT by a video enabled telemedicine application and verified that I am speaking with the correct person using two identifiers.  Location: Patient: home Provider: remote office [place], [place])   I discussed the limitations of evaluation and management by telemedicine and the availability of in person appointments. The patient expressed understanding and agreed to proceed.  I discussed the assessment and treatment plan with the patient. The patient was provided an opportunity to ask questions and all were answered. The patient agreed with the plan and demonstrated an understanding of the instructions.   The patient was advised to call back or seek an in-person evaluation if the symptoms worsen or if the condition fails to improve as anticipated.  I provided 60 minutes of non-face-to-face time during this encounter.   Kanai Hilger R Terrianne Cavness, LCSW   THERAPIST PROGRESS NOTE  Session Time: 4-5p  Participation Level: Active  Behavioral Response: Neat and Well GroomedAlertAnxious and Depressed  Type of Therapy: Individual Therapy  Treatment Goals addressed: Coping  Interventions: CBT  Summary: Christina Hill is a 25 y.o. female who presents with improving symptoms related to PTSD diagnosis. Pt reporting stable mood and that she is getting good quality and quantity of sleep.  Allowed pt safe space to explore and express thoughts and feelings associated with recent external stressors and life events. Explored pts relationship with brother and how pt feels responsible for the the choices that her brother continues to make. Pt was upset that she had plans for her birthday and that her parents got into an argument about pts brother. Pt states she feels her brother "ruined" the weekend. Pt made the statement that she is the "happiest that I have ever been in life".  Had pt process through her  thoughts and feelings and encouraged her to continue to engage in the events/situations that are contributing factors to her happiness. Continued recommendations are as follows: self care behaviors, positive social engagements, focusing on overall work/home/life balance, and focusing on positive physical and emotional wellness. '  Suicidal/Homicidal: No  Therapist Response: Completed SCARED assessment. Pt scored high in Panic disorder and separation anxiety.   Plan: Return again in 4 weeks.  Diagnosis: Axis I: Post Traumatic Stress Disorder    Axis II: No diagnosis    Ernest Haber Grabiela Wohlford, LCSW 10/21/2020

## 2020-10-29 ENCOUNTER — Emergency Department: Payer: Self-pay

## 2020-10-29 ENCOUNTER — Other Ambulatory Visit: Payer: Self-pay

## 2020-10-29 ENCOUNTER — Emergency Department
Admission: EM | Admit: 2020-10-29 | Discharge: 2020-10-29 | Disposition: A | Discharge status: Home / Self Care | Payer: Self-pay | Attending: Emergency Medicine | Admitting: Emergency Medicine

## 2020-10-29 DIAGNOSIS — Z8616 Personal history of COVID-19: Secondary | ICD-10-CM | POA: Insufficient documentation

## 2020-10-29 DIAGNOSIS — R1013 Epigastric pain: Secondary | ICD-10-CM | POA: Insufficient documentation

## 2020-10-29 DIAGNOSIS — Z79899 Other long term (current) drug therapy: Secondary | ICD-10-CM | POA: Insufficient documentation

## 2020-10-29 DIAGNOSIS — R112 Nausea with vomiting, unspecified: Secondary | ICD-10-CM | POA: Insufficient documentation

## 2020-10-29 DIAGNOSIS — J4520 Mild intermittent asthma, uncomplicated: Secondary | ICD-10-CM | POA: Insufficient documentation

## 2020-10-29 DIAGNOSIS — R110 Nausea: Secondary | ICD-10-CM

## 2020-10-29 DIAGNOSIS — R1011 Right upper quadrant pain: Secondary | ICD-10-CM | POA: Insufficient documentation

## 2020-10-29 LAB — CBC
HCT: 38.7 % (ref 36.0–46.0)
Hemoglobin: 13.5 g/dL (ref 12.0–15.0)
MCH: 26.8 pg (ref 26.0–34.0)
MCHC: 34.9 g/dL (ref 30.0–36.0)
MCV: 76.8 fL — ABNORMAL LOW (ref 80.0–100.0)
Platelets: 294 10*3/uL (ref 150–400)
RBC: 5.04 MIL/uL (ref 3.87–5.11)
RDW: 12.8 % (ref 11.5–15.5)
WBC: 11.5 10*3/uL — ABNORMAL HIGH (ref 4.0–10.5)
nRBC: 0 % (ref 0.0–0.2)

## 2020-10-29 LAB — URINALYSIS, COMPLETE (UACMP) WITH MICROSCOPIC
Bilirubin Urine: NEGATIVE
Glucose, UA: NEGATIVE mg/dL
Hgb urine dipstick: NEGATIVE
Ketones, ur: 20 mg/dL — AB
Leukocytes,Ua: NEGATIVE
Nitrite: NEGATIVE
Protein, ur: NEGATIVE mg/dL
Specific Gravity, Urine: 1.025 (ref 1.005–1.030)
pH: 9 — ABNORMAL HIGH (ref 5.0–8.0)

## 2020-10-29 LAB — LIPASE, BLOOD: Lipase: 34 U/L (ref 11–51)

## 2020-10-29 LAB — TROPONIN I (HIGH SENSITIVITY): Troponin I (High Sensitivity): 3 ng/L (ref <18)

## 2020-10-29 LAB — BASIC METABOLIC PANEL
Anion gap: 18 — ABNORMAL HIGH (ref 5–15)
Anion gap: 11 (ref 5–15)
BUN: 13 mg/dL (ref 6–20)
BUN: 10 mg/dL (ref 6–20)
CO2: 18 mmol/L — ABNORMAL LOW (ref 22–32)
CO2: 21 mmol/L — ABNORMAL LOW (ref 22–32)
Calcium: 10 mg/dL (ref 8.9–10.3)
Calcium: 9.1 mg/dL (ref 8.9–10.3)
Chloride: 106 mmol/L (ref 98–111)
Chloride: 107 mmol/L (ref 98–111)
Creatinine, Ser: 0.78 mg/dL (ref 0.44–1.00)
Creatinine, Ser: 0.65 mg/dL (ref 0.44–1.00)
GFR, Estimated: >60 mL/min (ref >60)
GFR, Estimated: >60 mL/min (ref >60)
Glucose, Bld: 151 mg/dL — ABNORMAL HIGH (ref 70–99)
Glucose, Bld: 112 mg/dL — ABNORMAL HIGH (ref 70–99)
Potassium: 3.3 mmol/L — ABNORMAL LOW (ref 3.5–5.1)
Potassium: 3.6 mmol/L (ref 3.5–5.1)
Sodium: 142 mmol/L (ref 135–145)
Sodium: 139 mmol/L (ref 135–145)

## 2020-10-29 LAB — HEPATIC FUNCTION PANEL
ALT: 15 U/L (ref 0–44)
AST: 26 U/L (ref 15–41)
Albumin: 4.2 g/dL (ref 3.5–5.0)
Alkaline Phosphatase: 47 U/L (ref 38–126)
Bilirubin, Direct: <0.1 mg/dL (ref 0.0–0.2)
Total Bilirubin: 0.7 mg/dL (ref 0.3–1.2)
Total Protein: 7.7 g/dL (ref 6.5–8.1)

## 2020-10-29 LAB — PREGNANCY, URINE: Preg Test, Ur: NEGATIVE

## 2020-10-29 MED ORDER — FAMOTIDINE IN NACL 20-0.9 MG/50ML-% IV SOLN
20.0000 mg | Freq: Once | INTRAVENOUS | Status: AC
  Administered 2020-10-29: 20 mg via INTRAVENOUS
  Filled 2020-10-29: qty 50

## 2020-10-29 MED ORDER — ONDANSETRON 4 MG PO TBDP
4.0000 mg | ORAL_TABLET | Freq: Once | ORAL | Status: AC
  Administered 2020-10-29: 4 mg via ORAL
  Filled 2020-10-29: qty 1

## 2020-10-29 MED ORDER — ONDANSETRON 4 MG PO TBDP: 4.0000 mg | ORAL_TABLET | Freq: Three times a day (TID) | ORAL | 0 refills | Status: AC | PRN

## 2020-10-29 MED ORDER — METOCLOPRAMIDE HCL 5 MG/ML IJ SOLN
10.0000 mg | Freq: Once | INTRAMUSCULAR | Status: AC
  Administered 2020-10-29: 10 mg via INTRAVENOUS
  Filled 2020-10-29: qty 2

## 2020-10-29 MED ORDER — ONDANSETRON HCL 4 MG/2ML IJ SOLN
4.0000 mg | Freq: Once | INTRAMUSCULAR | Status: AC
  Administered 2020-10-29: 4 mg via INTRAVENOUS
  Filled 2020-10-29: qty 2

## 2020-10-29 MED ORDER — SODIUM CHLORIDE 0.9 % IV BOLUS
1000.0000 mL | Freq: Once | INTRAVENOUS | Status: AC
  Administered 2020-10-29: 1000 mL via INTRAVENOUS

## 2020-10-29 NOTE — ED Triage Notes (Signed)
[place], pt c/o abd pain with N/V/D/ and chest pain.   181/115 [id number]
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
## 2020-11-25 ENCOUNTER — Other Ambulatory Visit: Payer: Self-pay

## 2020-11-25 ENCOUNTER — Ambulatory Visit: Payer: Managed Care, Other (non HMO) | Admitting: Licensed Clinical Social Worker

## 2020-11-25 NOTE — Progress Notes (Signed)
Pt logged in for session scheduled today and states that she forgot she had an appointment and that she is the only clinical person at work today.  LCSW counselor expressed understanding and closed out the session. Pt stated that she would call the office to reschedule today's OPT.

## 2020-12-26 ENCOUNTER — Ambulatory Visit (INDEPENDENT_AMBULATORY_CARE_PROVIDER_SITE_OTHER): Payer: Self-pay | Admitting: Licensed Clinical Social Worker

## 2020-12-26 ENCOUNTER — Other Ambulatory Visit: Payer: Self-pay

## 2020-12-26 DIAGNOSIS — Z91199 Patient's noncompliance with other medical treatment and regimen due to unspecified reason: Secondary | ICD-10-CM

## 2020-12-26 NOTE — Progress Notes (Signed)
LCSW counselor tried to connect with patient for scheduled appointment via MyChart video text request x 2 and email request; also tried to connect via phone without success. LCSW counselor left message for patient to call office number to reschedule OPT appointment.  

## 2020-12-30 ENCOUNTER — Other Ambulatory Visit: Payer: Self-pay

## 2020-12-30 ENCOUNTER — Ambulatory Visit (INDEPENDENT_AMBULATORY_CARE_PROVIDER_SITE_OTHER): Payer: Managed Care, Other (non HMO) | Admitting: Licensed Clinical Social Worker

## 2020-12-30 DIAGNOSIS — F4310 Post-traumatic stress disorder, unspecified: Secondary | ICD-10-CM | POA: Diagnosis not present

## 2020-12-30 NOTE — Progress Notes (Signed)
Virtual Visit via Video Note  I connected with Christina Hill on 12/30/20 at  9:00 AM EDT by a video enabled telemedicine application and verified that I am speaking with the correct person using two identifiers.  Location: Patient: home Provider: remote office Christina [place], [place])   I discussed the limitations of evaluation and management by telemedicine and the availability of in person appointments. The patient expressed understanding and agreed to proceed.   I discussed the assessment and treatment plan with the patient. The patient was provided an opportunity to ask questions and all were answered. The patient agreed with the plan and demonstrated an understanding of the instructions.   The patient was advised to call back or seek an in-person evaluation if the symptoms worsen or if the condition fails to improve as anticipated.  I provided 60 minutes of non-face-to-face time during this encounter.   Christina Hill R Christina Thede, LCSW   THERAPIST PROGRESS NOTE  Session Time:9-10a  Participation Level: Active  Behavioral Response: Neat and Well GroomedAlertAnxious and Depressed  Type of Therapy: Individual Therapy  Treatment Goals addressed:  Goal: LTG: Reduce frequency, intensity, and duration of PTSD symptoms so daily functioning is improved: Input needed on appropriate metric.  pt self report Outcome: Progressing Goal: STG: Practice interpersonal effectiveness skills 7 times per week for the next 16 weeks Outcome: Progressing Interventions:  Intervention: Work with patient to identify the major components of a recent episode of anxiety: physical symptoms, major thoughts and images, and major behaviors they experienced Intervention: Assist with relaxation techniques, as appropriate (deep breathing exercises, meditation, guided imagery) Intervention: WORK WITH Christina "Brooke" TO IDENTIFY 3 BEHAVIORS TO SUPPORT THEIR RECOVERY FROM TRAUMATIC INVALIDATION Intervention: Educate  patient on: Substance abuse Intervention: Assist with coping skills and behavior Summary: Christina Hill is a 25 y.o. female who presents with improving symptoms related to PTSD diagnosis. Pt reports that overall mood is improving recently. Pt reports that sleep quality and quantity is good with occasional days of insomnia.  Allowed pt to explore and express thoughts and feelings associated with recent life situations and external stressors. Explored pts thoughts and feelings about brother's recent relapse. Discussed setting boundaries with family members, and pt focusing on overall self care for her own healing. Discussed pts parents perspective of brother's relapse and how this could impact upcoming holidays. Reviewed pts coping skills for depression and anxiety.   Continued recommendations are as follows: self care behaviors, positive social engagements, focusing on overall work/home/life balance, and focusing on positive physical and emotional wellness.   Suicidal/Homicidal: No  Therapist Response: Pt is continuing to apply interventions learned in session into daily life situations. Pt is currently on track to meet goals utilizing interventions mentioned above. Personal growth and progress noted. Treatment to continue as indicated.   Pt willing to start group sessions through [facility]--referred to website and encouraged pt to get involved in any groups that she may find beneficial.   Plan: Return again in 4 weeks.  Diagnosis: Axis I: PTSD    Axis II: No diagnosis    Christina Hill Christina Beitzel, LCSW 12/30/2020

## 2020-12-30 NOTE — Plan of Care (Signed)
  Problem: Reduce the negative impact trauma related symptoms have on social, occupational, and family functioning. Goal: LTG: Reduce frequency, intensity, and duration of PTSD symptoms so daily functioning is improved: Input needed on appropriate metric.  pt self report Outcome: Progressing Goal: STG: Practice interpersonal effectiveness skills 7 times per week for the next 16 weeks Outcome: Progressing Intervention: Work with patient to identify the major components of a recent episode of anxiety: physical symptoms, major thoughts and images, and major behaviors they experienced Intervention: Assist with relaxation techniques, as appropriate (deep breathing exercises, meditation, guided imagery) Intervention: WORK WITH [name] TO IDENTIFY 3 BEHAVIORS TO SUPPORT THEIR RECOVERY FROM TRAUMATIC INVALIDATION Intervention: Educate patient on: Substance abuse Intervention: Assist with coping skills and behavior

## 2021-02-10 ENCOUNTER — Other Ambulatory Visit: Payer: Self-pay

## 2021-02-10 ENCOUNTER — Ambulatory Visit (INDEPENDENT_AMBULATORY_CARE_PROVIDER_SITE_OTHER): Payer: Self-pay | Admitting: Licensed Clinical Social Worker

## 2021-02-10 DIAGNOSIS — Z91199 Patient's noncompliance with other medical treatment and regimen due to unspecified reason: Secondary | ICD-10-CM

## 2021-02-10 NOTE — Progress Notes (Signed)
LCSW counselor tried to connect with patient for scheduled appointment via MyChart video text request x 2 and email request; also tried to connect via phone without success. LCSW counselor left message for patient to call office number to reschedule OPT appointment.  

## 2022-07-18 IMAGING — CR DG RIBS W/ CHEST 3+V*L*
5 series · 5 of 5 positions shown · non-contrast
Comparison: None.

CLINICAL DATA: Fell 10/17/2019.  Left anterior chest pain.

EXAM:
LEFT RIBS AND CHEST - 3+ VIEW

[chest pa]
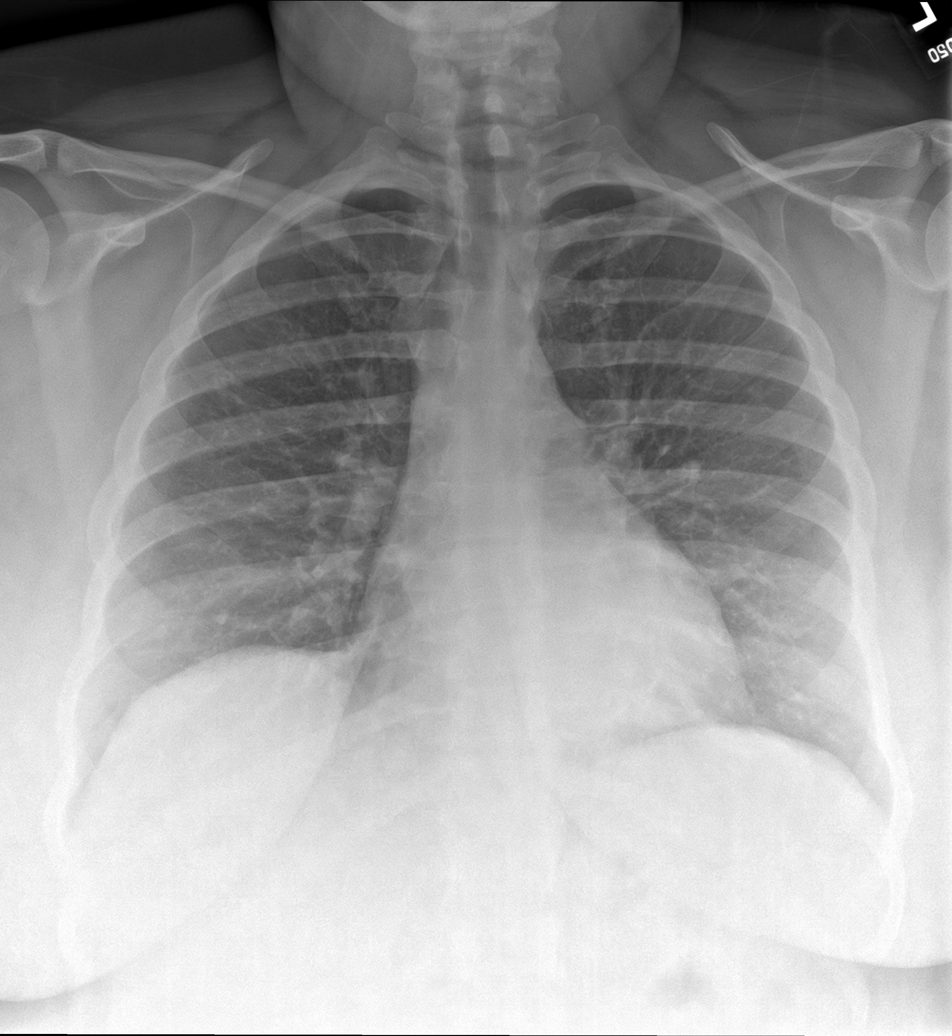

[rib pa (1 of 2)]
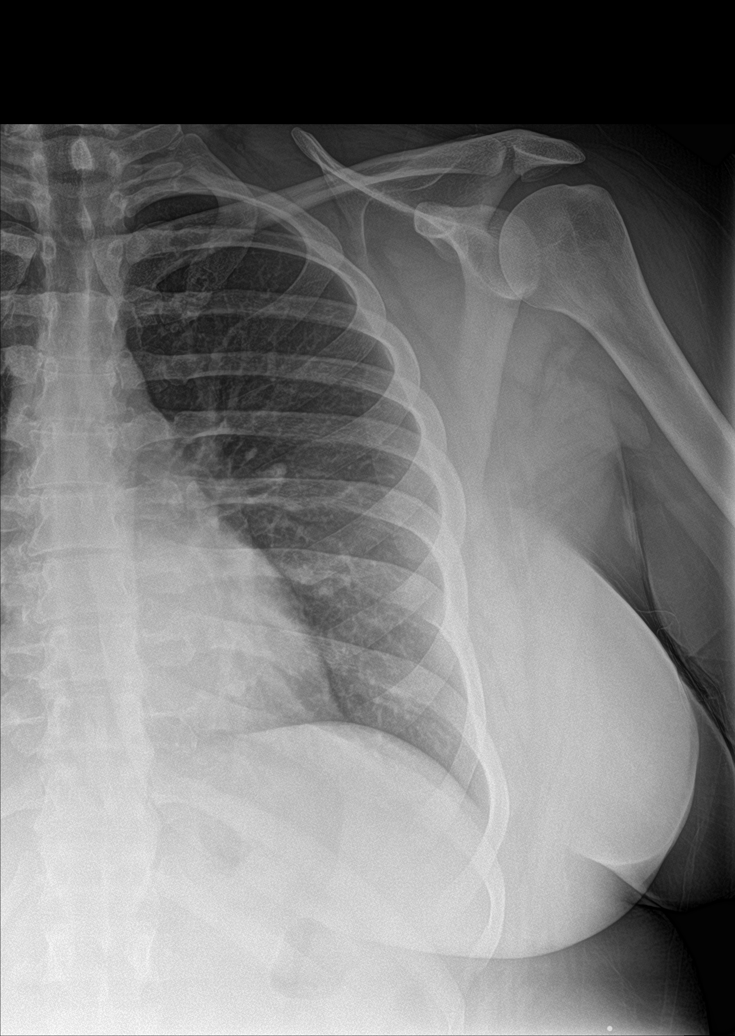

[rib pa obl]
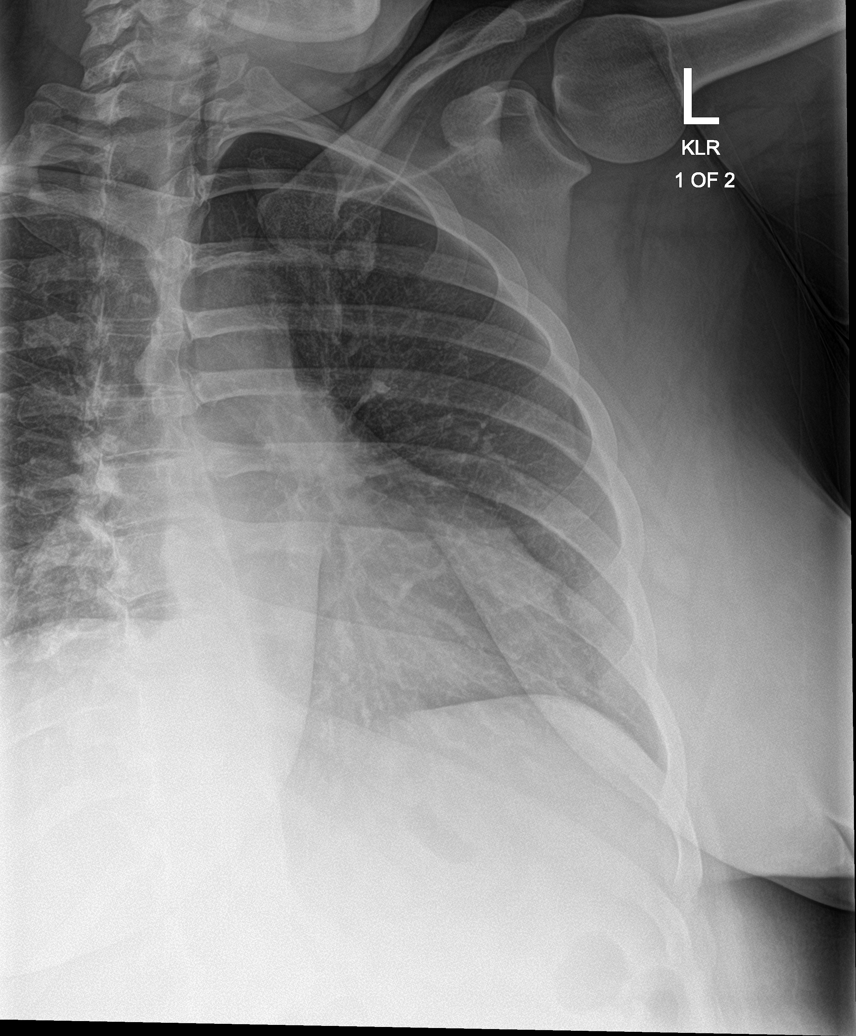

[rib ap obl]
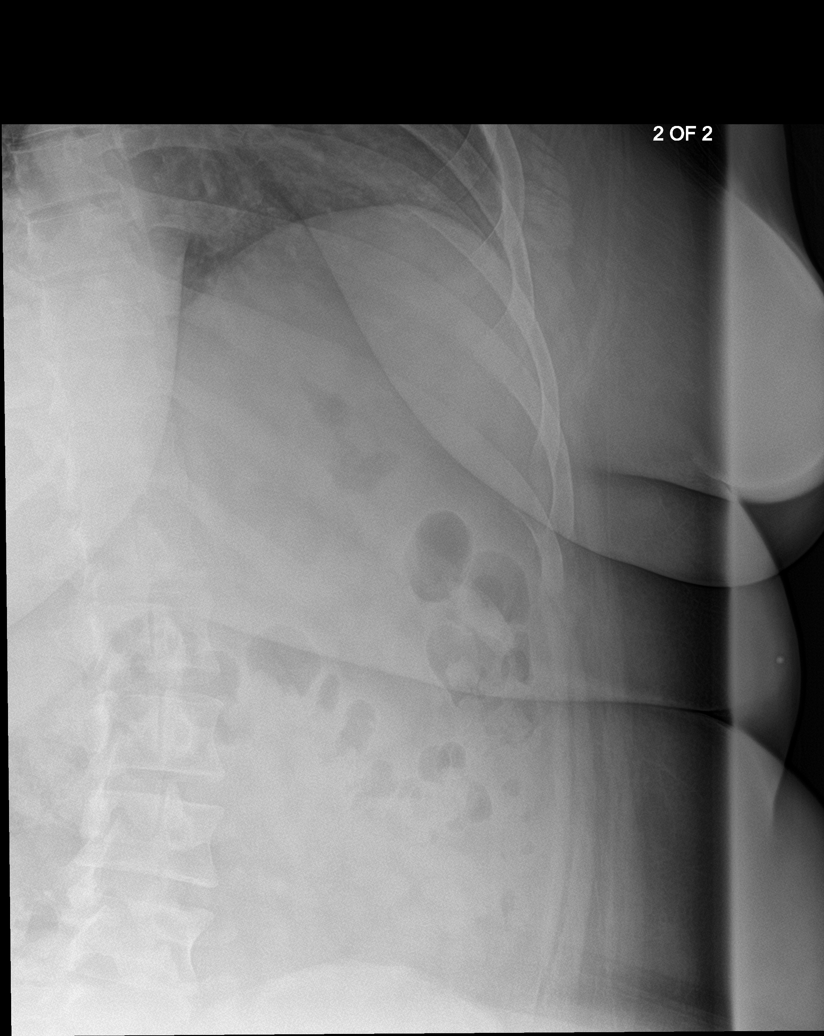

[rib pa (2 of 2)]
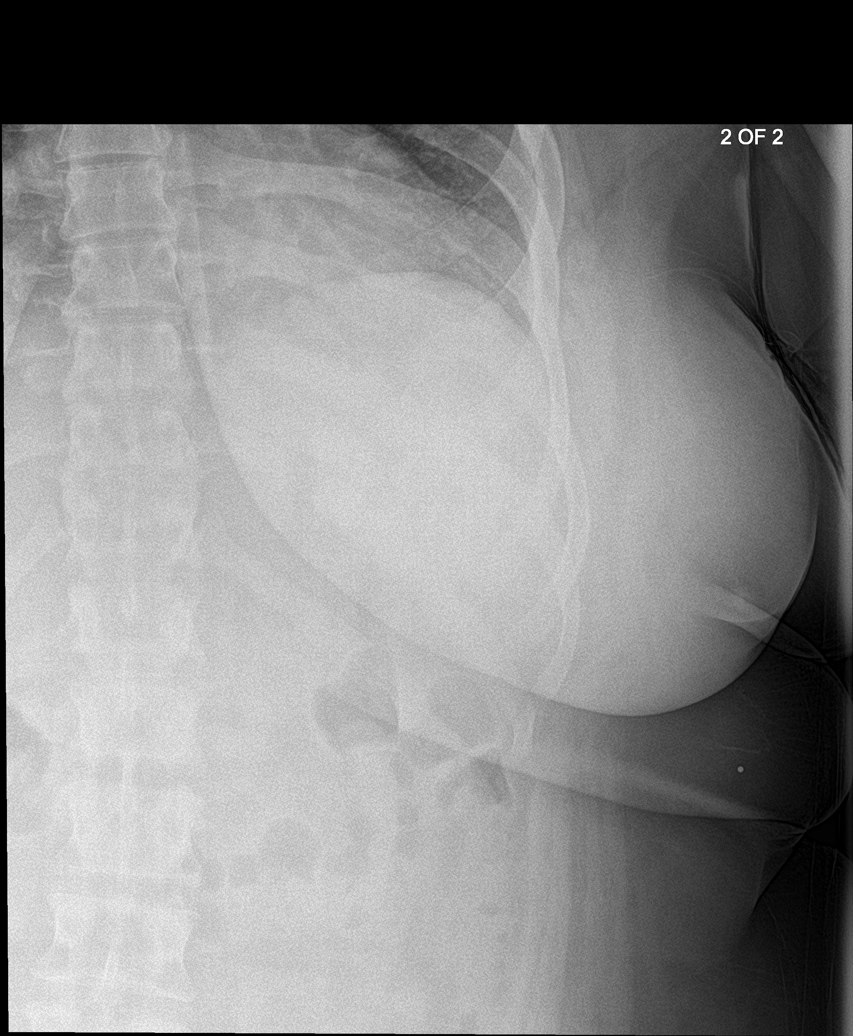

[5 of 5 positions shown; findings below may reference images not displayed]

FINDINGS: No fracture or other bone lesions are seen involving the ribs. There
is no evidence of pneumothorax or pleural effusion. Both lungs are
clear. Heart size and mediastinal contours are within normal limits.
IMPRESSION: Negative.

## 2022-07-18 IMAGING — CR DG TOE GREAT 2+V*L*
3 series · 3 of 3 positions shown · non-contrast
Comparison: None.

CLINICAL DATA: Fell 10/17/2019.

EXAM:
LEFT GREAT TOE

[toe ap]
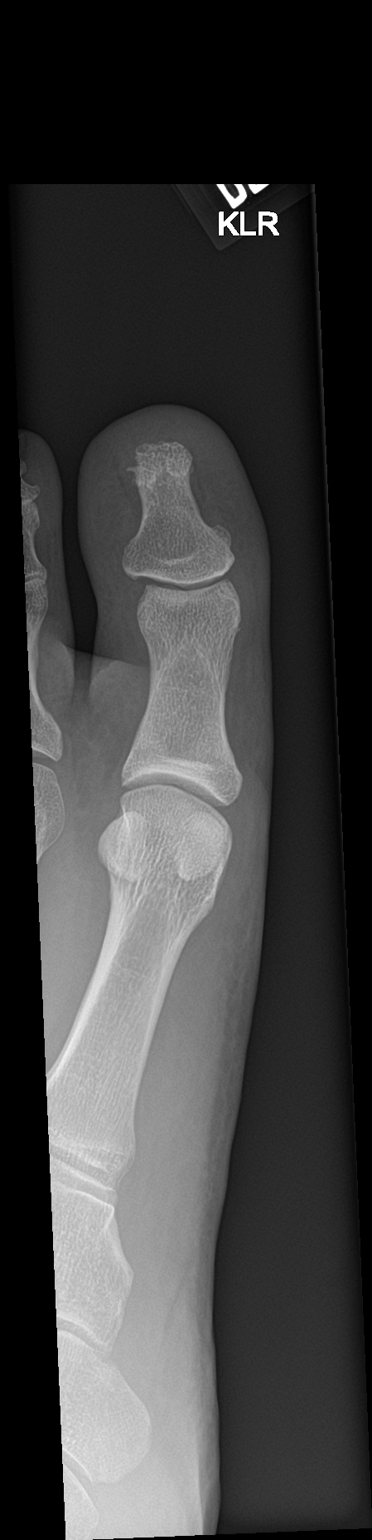

[toe obl]
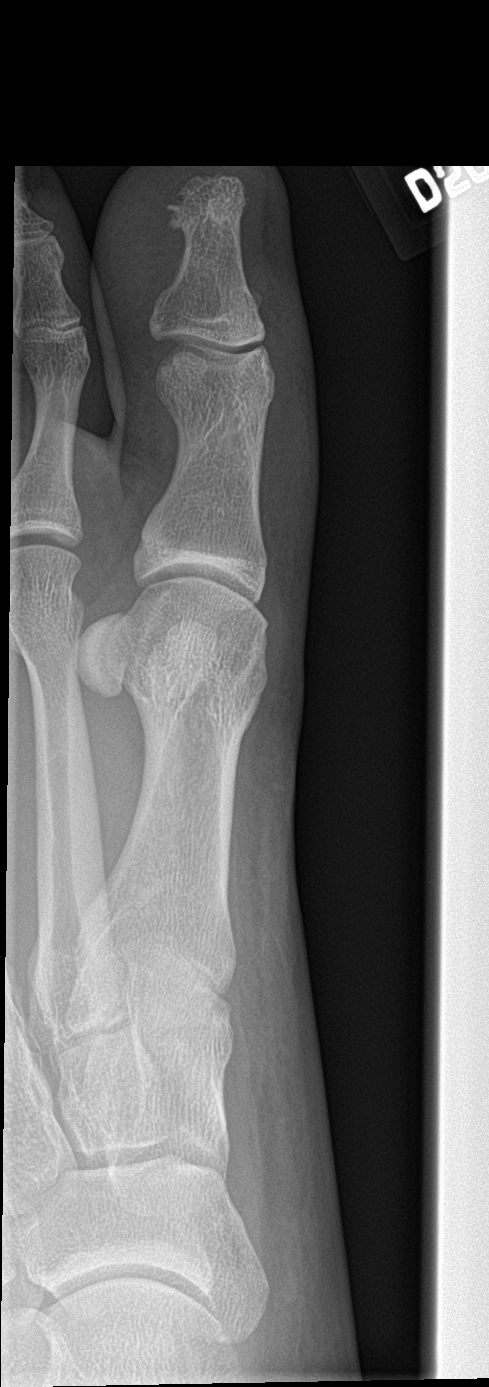

[toe lat]
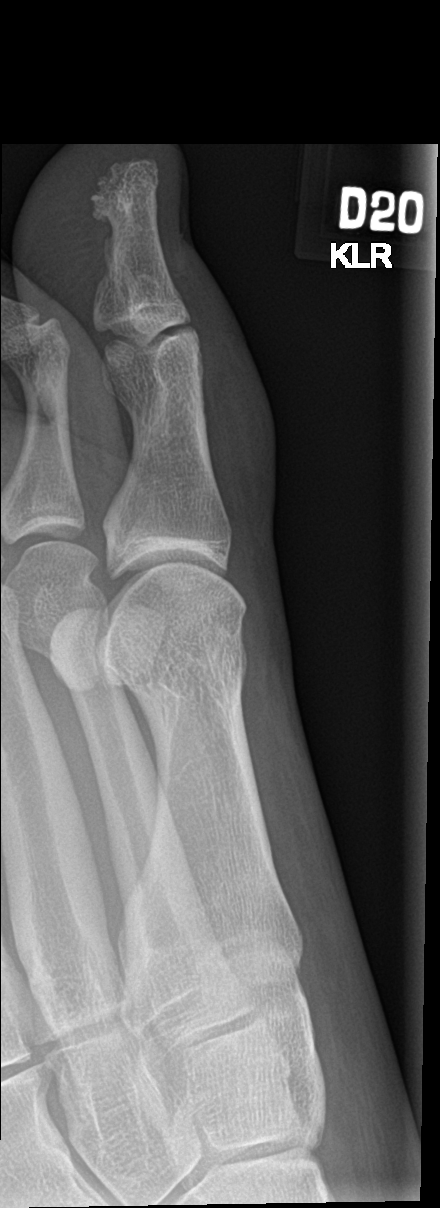

[3 of 3 positions shown; findings below may reference images not displayed]

FINDINGS: There is no evidence of fracture or dislocation. There is no
evidence of arthropathy or other focal bone abnormality. Soft
tissues are unremarkable.
IMPRESSION: Negative.
